# Patient Record
Sex: Female | Born: 1960
Health system: Southern US, Community
[De-identification: ages and names within clinical notes are randomized; demographics above are authoritative.]

## PROBLEM LIST (undated history)

## (undated) DIAGNOSIS — C801 Malignant (primary) neoplasm, unspecified: Secondary | ICD-10-CM

## (undated) DIAGNOSIS — E785 Hyperlipidemia, unspecified: Secondary | ICD-10-CM

## (undated) HISTORY — PX: TUBAL LIGATION: SHX77

## (undated) HISTORY — PX: TOOTH EXTRACTION: SUR596

## (undated) HISTORY — PX: ADENOIDECTOMY: SUR15

## (undated) HISTORY — PX: OTHER SURGICAL HISTORY: SHX169

## (undated) HISTORY — DX: Malignant (primary) neoplasm, unspecified: C80.1

## (undated) HISTORY — DX: Hyperlipidemia, unspecified: E78.5

## (undated) HISTORY — PX: MELANOMA EXCISION: SHX5266

---

## 1997-07-22 DIAGNOSIS — C801 Malignant (primary) neoplasm, unspecified: Secondary | ICD-10-CM

## 1997-07-22 HISTORY — DX: Malignant (primary) neoplasm, unspecified: C80.1

## 1999-09-27 DIAGNOSIS — C439 Malignant melanoma of skin, unspecified: Secondary | ICD-10-CM | POA: Insufficient documentation

## 1999-09-27 HISTORY — DX: Malignant melanoma of skin, unspecified: C43.9

## 1999-11-15 ENCOUNTER — Other Ambulatory Visit: Admission: RE | Admit: 1999-11-15 | Discharge: 1999-11-15 | Payer: Self-pay | Admitting: Obstetrics and Gynecology

## 2001-04-16 ENCOUNTER — Other Ambulatory Visit: Admission: RE | Admit: 2001-04-16 | Discharge: 2001-04-16 | Payer: Self-pay | Admitting: Obstetrics and Gynecology

## 2002-05-14 ENCOUNTER — Other Ambulatory Visit: Admission: RE | Admit: 2002-05-14 | Discharge: 2002-05-14 | Payer: Self-pay | Admitting: Obstetrics and Gynecology

## 2003-05-18 ENCOUNTER — Other Ambulatory Visit: Admission: RE | Admit: 2003-05-18 | Discharge: 2003-05-18 | Payer: Self-pay | Admitting: Obstetrics and Gynecology

## 2004-07-27 ENCOUNTER — Other Ambulatory Visit: Admission: RE | Admit: 2004-07-27 | Discharge: 2004-07-27 | Payer: Self-pay | Admitting: Obstetrics and Gynecology

## 2012-05-08 ENCOUNTER — Other Ambulatory Visit: Payer: Self-pay | Admitting: Obstetrics and Gynecology

## 2012-05-08 DIAGNOSIS — R928 Other abnormal and inconclusive findings on diagnostic imaging of breast: Secondary | ICD-10-CM

## 2012-05-12 ENCOUNTER — Ambulatory Visit
Admission: RE | Admit: 2012-05-12 | Discharge: 2012-05-12 | Disposition: A | Discharge status: Home / Self Care | Payer: Self-pay | Source: Ambulatory Visit | Attending: Obstetrics and Gynecology | Admitting: Obstetrics and Gynecology

## 2012-05-12 DIAGNOSIS — R928 Other abnormal and inconclusive findings on diagnostic imaging of breast: Secondary | ICD-10-CM

## 2012-05-13 ENCOUNTER — Other Ambulatory Visit: Payer: Self-pay

## 2012-07-22 LAB — HM COLONOSCOPY

## 2013-06-30 ENCOUNTER — Ambulatory Visit (INDEPENDENT_AMBULATORY_CARE_PROVIDER_SITE_OTHER): Payer: BC Managed Care – PPO

## 2013-06-30 VITALS — BP 104/69 | HR 79 | Resp 20 | Ht 71.0 in | Wt 210.0 lb

## 2013-06-30 DIAGNOSIS — B351 Tinea unguium: Secondary | ICD-10-CM

## 2013-06-30 DIAGNOSIS — M79609 Pain in unspecified limb: Secondary | ICD-10-CM

## 2013-06-30 MED ORDER — EFINACONAZOLE 10 % EX SOLN: CUTANEOUS | Status: DC

## 2013-06-30 NOTE — Progress Notes (Signed)
   Subjective:    Patient ID: Karen Cobb, female    DOB: 08/02/1960, 52 y.o.   MRN: [id number]  "Look at my toenails again.  I had that laser done last year.  It was doing good until  August.  I still been using the Formula 3."  HPI patient presents this time did have some improvement with the nail medication was using formula 3 for over your.    Review of Systems  Constitutional: Negative.   Respiratory: Negative.   Cardiovascular: Negative.   Gastrointestinal: Negative.   Musculoskeletal: Negative.   Skin: Negative.   Allergic/Immunologic: Negative.   Neurological: Negative.   Hematological: Negative.   Psychiatric/Behavioral: Negative.   All other systems reviewed and are negative.       Objective:   Physical Exam Neurovascular status is intact with pedal pulses palpable DP and PT +2/4 bilateral Refill timed 3-4 seconds all digits. Skin temperature is warm turgor normal no edema rubor pallor or varicosities noted. Neurologically epicritic and proprioceptive sensations intact bilateral with normal plantar response and DTRs. Dermatologically there is some yellowing and thickening discoloration of the right hallux in particular second third digits of the right foot. Remaining digits are relatively normal trophic. There is no secondary infection patient has a long second third toes more so on right than left foot. Has slight bunion deformity as well. No other abnormalities noted semirigid digital contractures the toes are identified. There is keratosis on the end of the third toe right foot which suggest that her shoes may be too short. Patient is advised of this and look into getting longer more appropriately fitting shoes to       Assessment & Plan:  Assessment this time onychomycosis affecting his 3 of the nails right hallux second third digit. No secondary infection is noted there was improvement of almost complete clearing and then it since has reverted back with discoloration  thickening yellowing. At this time discussed options at this time prescriptions for Julblia, been using topical nail antifungal is dispensed with instructions for use once daily for 12 months duration. At this time patient is also advised that and likely have her return in one month for a free retreatment with the laser palpable eradicating the fungus in her nails scheduled followup laser treatments in one month and plan for 12 months duration treatment topical antifungal at this time.  Alvan Dame DPM

## 2013-06-30 NOTE — Patient Instructions (Signed)

## 2013-07-28 ENCOUNTER — Ambulatory Visit (INDEPENDENT_AMBULATORY_CARE_PROVIDER_SITE_OTHER): Payer: BC Managed Care – PPO

## 2013-07-28 VITALS — Ht 71.0 in | Wt 210.0 lb

## 2013-07-28 DIAGNOSIS — B351 Tinea unguium: Secondary | ICD-10-CM

## 2013-07-28 NOTE — Patient Instructions (Signed)
Onychomycosis/Fungal Toenails  WHAT IS IT? An infection that lies within the keratin of your nail plate that is caused by a fungus.  WHY ME? Fungal infections affect all ages, sexes, races, and creeds.  There may be many factors that predispose you to a fungal infection such as age, coexisting medical conditions such as diabetes, or an autoimmune disease; stress, medications, fatigue, genetics, etc.  Bottom line: fungus thrives in a warm, moist environment and your shoes offer such a location.  IS IT CONTAGIOUS? Theoretically, yes.  You do not want to share shoes, nail clippers or files with someone who has fungal toenails.  Walking around barefoot in the same room or sleeping in the same bed is unlikely to transfer the organism.  It is important to realize, however, that fungus can spread easily from one nail to the next on the same foot.  HOW DO WE TREAT THIS?  There are several ways to treat this condition.  Treatment may depend on many factors such as age, medications, pregnancy, liver and kidney conditions, etc.  It is best to ask your doctor which options are available to you.  1. No treatment.   Unlike many other medical concerns, you can live with this condition.  However for many people this can be a painful condition and may lead to ingrown toenails or a bacterial infection.  It is recommended that you keep the nails cut short to help reduce the amount of fungal nail. 2. Topical treatment.  These range from herbal remedies to prescription strength nail lacquers.  About 40-50% effective, topicals require twice daily application for approximately 9 to 12 months or until an entirely new nail has grown out.  The most effective topicals are medical grade medications available through physicians offices. 3. Oral antifungal medications.  With an 80-90% cure rate, the most common oral medication requires 3 to 4 months of therapy and stays in your system for a year as the new nail grows out.  Oral  antifungal medications do require blood work to make sure it is a safe drug for you.  A liver function panel will be performed prior to starting the medication and after the first month of treatment.  It is important to have the blood work performed to avoid any harmful side effects.  In general, this medication safe but blood work is required. 4. Laser Therapy.  This treatment is performed by applying a specialized laser to the affected nail plate.  This therapy is noninvasive, fast, and non-painful.  It is not covered by insurance and is therefore, out of pocket.  The results have been very good with a 80-95% cure rate.  The [facility] is the only practice in the area to offer this therapy. 5. Permanent Nail Avulsion.  Removing the entire nail so that a new nail will not grow back.   Apply topical antifungal to affected nails daily for another 6-12 months.  Reappointed 3 months for a third laser treatment

## 2013-07-28 NOTE — Progress Notes (Signed)
   Subjective:    Patient ID: Karen Cobb, female    DOB: 1960-10-05, 53 y.o.   MRN: [id number]  "I'm here for laser treatment."  HPI    Review of Systems any changes or findings patient did not receive her topical antifungal she called in [place] and we'll get reassure for topical Jubilee a     Objective:   Physical Exam Neurovascular status is intact nails 13 and 5 of the right great right foot continues to have some white discoloration and friability consistent with onychomycosis at this time a second laser treatment is carried out as previous he discussed will do a third treatment with the next 2-3 months for followup. Patient is administered the laser treatment and following protocols into. Q. clear laser was utilized at this time with the following settings frequency level 5 Hz energy level for icing 1.03 J of energy influence of 7.5 J patient did receive treatment 2 the hallux third and fifth digits of the left foot her correction right foot total of 337 pulses were delivered to the 3 toes. Majority concentrating on the hallux. Patient tolerated this well and will continue with topical antifungal therapies in a home.       Assessment & Plan:  Assessment recalcitrant slowly healing improving tinea or onychomycosis affected nails 13 and 5 right foot second laser treatment carried out at this time we'll continue with topical antifungal therapies as indicated reappointed to 3 months for a third laser retreatment as recommended. Contact us visiting changes or exacerbations occur in the interim.  [name] DPM

## 2013-10-01 ENCOUNTER — Ambulatory Visit (INDEPENDENT_AMBULATORY_CARE_PROVIDER_SITE_OTHER): Payer: BC Managed Care – PPO

## 2013-10-01 VITALS — BP 129/79 | HR 67 | Resp 12

## 2013-10-01 DIAGNOSIS — B351 Tinea unguium: Secondary | ICD-10-CM

## 2013-10-01 NOTE — Patient Instructions (Signed)
Onychomycosis/Fungal Toenails  WHAT IS IT? An infection that lies within the keratin of your nail plate that is caused by a fungus.  WHY ME? Fungal infections affect all ages, sexes, races, and creeds.  There may be many factors that predispose you to a fungal infection such as age, coexisting medical conditions such as diabetes, or an autoimmune disease; stress, medications, fatigue, genetics, etc.  Bottom line: fungus thrives in a warm, moist environment and your shoes offer such a location.  IS IT CONTAGIOUS? Theoretically, yes.  You do not want to share shoes, nail clippers or files with someone who has fungal toenails.  Walking around barefoot in the same room or sleeping in the same bed is unlikely to transfer the organism.  It is important to realize, however, that fungus can spread easily from one nail to the next on the same foot.  HOW DO WE TREAT THIS?  There are several ways to treat this condition.  Treatment may depend on many factors such as age, medications, pregnancy, liver and kidney conditions, etc.  It is best to ask your doctor which options are available to you.  1. No treatment.   Unlike many other medical concerns, you can live with this condition.  However for many people this can be a painful condition and may lead to ingrown toenails or a bacterial infection.  It is recommended that you keep the nails cut short to help reduce the amount of fungal nail. 2. Topical treatment.  These range from herbal remedies to prescription strength nail lacquers.  About 40-50% effective, topicals require twice daily application for approximately 9 to 12 months or until an entirely new nail has grown out.  The most effective topicals are medical grade medications available through physicians offices. 3. Oral antifungal medications.  With an 80-90% cure rate, the most common oral medication requires 3 to 4 months of therapy and stays in your system for a year as the new nail grows out.  Oral  antifungal medications do require blood work to make sure it is a safe drug for you.  A liver function panel will be performed prior to starting the medication and after the first month of treatment.  It is important to have the blood work performed to avoid any harmful side effects.  In general, this medication safe but blood work is required. 4. Laser Therapy.  This treatment is performed by applying a specialized laser to the affected nail plate.  This therapy is noninvasive, fast, and non-painful.  It is not covered by insurance and is therefore, out of pocket.  The results have been very good with a 80-95% cure rate.  The [facility] is the only practice in the area to offer this therapy. 5. Permanent Nail Avulsion.  Removing the entire nail so that a new nail will not grow back.  Continue daily applications of topical antifungal for another 6 months

## 2013-10-01 NOTE — Progress Notes (Signed)
   Subjective:    Patient ID: Karen Cobb, female    DOB: 08-13-1960, 52 y.o.   MRN: [id number]  HPI TREATMENT #3 '' RT FOOT TOES ARE GETTING A LITTLE BETTER.''   Review of Systems no new changes or findings     Objective:   Physical Exam Neurovascular status is intact pedal pulses palpable epicritic and proprioceptive sensations intact and symmetric bilateral normal plantar response DTRs not listed patient does have continued onychomycosis nails 13 and 5 of the left foot been applying Karen Cobb she's had to laser treatments at this time is ready for a third Mr. treatment at this time Q. clear laser was utilized at energy level frequency of L5 hurts energy level 1.03 J influence of 7.5 J patient did receive at this time hallux second and third digits a total of 338 pulses were delivered at this time. Operative report caused by protection in a seated your utilizing or area this time. The patient how the procedure without and difficulty discomfort suggested followup in 6 months continue with topical antifungal therapy date daily applications as instructed       Assessment & Plan:  Assessment this time third laser retreatment for fungus nails first third and fifth digit left foot is carried out at this time with little difficulty or discomfort patient will followup in 6 months for reevaluation continue with topical Karen Cobb application daily. Next  Karen Cobb DPM

## 2014-04-08 ENCOUNTER — Ambulatory Visit (INDEPENDENT_AMBULATORY_CARE_PROVIDER_SITE_OTHER): Payer: BC Managed Care – PPO

## 2014-04-08 VITALS — BP 138/80 | HR 64 | Resp 12

## 2014-04-08 DIAGNOSIS — B351 Tinea unguium: Secondary | ICD-10-CM

## 2014-04-08 MED ORDER — EFINACONAZOLE 10 % EX SOLN: CUTANEOUS | Status: DC

## 2014-04-08 NOTE — Progress Notes (Signed)
   Subjective:    Patient ID: ENVY MENO, female    DOB: Feb 10, 1961, 53 y.o.   MRN: [id number]  HPI ''RT 1ST, 3RD, AND 5TH TOENAIL ARE LOOKS MUCH BETTER BUT GREAT TOENAIL HAVE BUBBLE/POCKET SOMETIMES.''   Review of Systems no new findings or systemic changes noted     Objective:   Physical Exam Patient been applying Gregary Signs the affected nails and there was some improvement in nails improve particular hallux nail bed and the third and fifth toes however may stop sending the medication. Is my understanding that the company has changed contracts with previous pharmacy and no longer shipping will need to read issued a prescription for Gregary Signs to the new pharmacy. Neurovascular status otherwise intact no other new changes noted no pain or discomfort in the nails improving with the topical medications however needs to continue for lace other 6 or more months       Assessment & Plan:  Assessment onychomycosis responding to topical Gregary Signs patient will continue with the medication represcribed Gregary Signs to [facility] at this time patient advised to continue daily applications as instructed recheck in 6-12 months for followup next  [name] DPM

## 2014-04-08 NOTE — Patient Instructions (Signed)
Your medication will not come for new pharmacy [facility].  Continue to apply Jublia once daily to each affected toenail for 6-12 additional month

## 2014-06-01 ENCOUNTER — Telehealth: Payer: Self-pay | Admitting: *Deleted

## 2014-06-01 NOTE — Telephone Encounter (Signed)
Patient called requesting the phone number for the pharmacy that she gets her Jublia from.  I called and left her a message that [facility] number is [phone].  Call if you have any further questions.

## 2015-06-19 LAB — HM PAP SMEAR

## 2015-06-19 LAB — HM MAMMOGRAPHY: HM Mammogram: NORMAL

## 2015-07-31 ENCOUNTER — Telehealth: Payer: Self-pay | Admitting: Behavioral Health

## 2015-07-31 ENCOUNTER — Encounter: Payer: Self-pay | Admitting: Behavioral Health

## 2015-07-31 NOTE — Telephone Encounter (Signed)
Pre-Visit Call completed with patient and chart updated.   Pre-Visit Info documented in Specialty Comments under [facility].    

## 2015-08-01 ENCOUNTER — Encounter: Payer: Self-pay | Admitting: Family

## 2015-08-01 ENCOUNTER — Ambulatory Visit (INDEPENDENT_AMBULATORY_CARE_PROVIDER_SITE_OTHER): Payer: 59 | Admitting: Family

## 2015-08-01 VITALS — BP 117/68 | HR 66 | Temp 97.9°F | Resp 18 | Ht 70.5 in | Wt 220.4 lb

## 2015-08-01 DIAGNOSIS — Z23 Encounter for immunization: Secondary | ICD-10-CM

## 2015-08-01 DIAGNOSIS — Z86006 Personal history of melanoma in-situ: Secondary | ICD-10-CM

## 2015-08-01 DIAGNOSIS — E785 Hyperlipidemia, unspecified: Secondary | ICD-10-CM | POA: Diagnosis not present

## 2015-08-01 DIAGNOSIS — Z85820 Personal history of malignant melanoma of skin: Secondary | ICD-10-CM | POA: Diagnosis not present

## 2015-08-01 DIAGNOSIS — Z0000 Encounter for general adult medical examination without abnormal findings: Secondary | ICD-10-CM

## 2015-08-01 NOTE — Progress Notes (Signed)
Pre visit review using our clinic review tool, if applicable. No additional management support is needed unless otherwise documented below in the visit note. 

## 2015-08-01 NOTE — Assessment & Plan Note (Signed)
Continue routine skin screenings.

## 2015-08-01 NOTE — Progress Notes (Signed)
Subjective:    Patient ID: Karen Cobb, female    DOB: 1961/01/10, 55 y.o.   MRN: [id number]  HPI  Karen Cobb is a 55 yr old female who presents today to establish care.  Pmhx is significant for:  Hyperlipidemia- she in on lipitor 10mg .  (she is followed at [facility]) had labs in November  Melanoma-foot (1999) reports that this was in situ- sees Dr. Denna Haggard for routine skin screening.    Patient presents today for complete physical.  Immunizations: >10 yrs ago Diet: fair diet.  Has lost 15 pounds since the summer Exercise: exercises 5 days a week.  Bike elliptical, treadmill, weights Colonoscopy: 2014- normal per patient Dexa: never had bone density Pap Smear: 11/16- normal Mammogram: 11/16- normal Dental: up to date Vision- 10/16    Review of Systems  Constitutional: Negative for unexpected weight change.  HENT: Negative for rhinorrhea.   Eyes: Negative for visual disturbance.  Respiratory: Negative for cough.   Cardiovascular: Negative for leg swelling.  Gastrointestinal: Negative for diarrhea and constipation.  Genitourinary: Negative for dysuria and frequency.  Musculoskeletal: Negative for myalgias and arthralgias.  Skin: Negative for rash.  Neurological: Negative for headaches.  Hematological: Negative for adenopathy.  Psychiatric/Behavioral:       Denies depression/anxiety       Past Medical History  Diagnosis Date  . Hyperlipidemia   . Cancer ([place]) 1999    melanoma    Social History   Social History  . Marital Status: Married    Spouse Name: N/A  . Number of Children: N/A  . Years of Education: N/A   Occupational History  . Not on file.   Social History Main Topics  . Smoking status: Former Research scientist (life sciences)  . Smokeless tobacco: Never Used  . Alcohol Use: 0.0 oz/week    0 Standard drinks or equivalent per week     Comment: rarely  . Drug Use: No  . Sexual Activity: Not on file   Other Topics Concern  . Not on file   Social History  Narrative    Past Surgical History  Procedure Laterality Date  . Tubal ligation    . Adenoidectomy    . Melanoma excision Right     foot  . Tooth extraction      Family History  Problem Relation Age of Onset  . Cancer Mother   . Heart disease Father   . Hypertension Father     No Known Allergies  No current outpatient prescriptions on file prior to visit.   No current facility-administered medications on file prior to visit.    BP 117/68 mmHg  Pulse 66  Temp(Src) 97.9 F (36.6 C) (Oral)  Resp 18  Ht 5' 10.5" (1.791 m)  Wt 220 lb 6.4 oz (99.973 kg)  BMI 31.17 kg/m2  SpO2 100%    Objective:   Physical Exam Physical Exam  Constitutional: She is oriented to person, place, and time. She appears well-developed and well-nourished. No distress.  HENT:  Head: Normocephalic and atraumatic.  Right Ear: Tympanic membrane and ear canal normal.  Left Ear: Tympanic membrane and ear canal normal.  Mouth/Throat: Oropharynx is clear and moist.  Eyes: Pupils are equal, round, and reactive to light. No scleral icterus.  Neck: Normal range of motion. No thyromegaly present.  Cardiovascular: Normal rate and regular rhythm.   No murmur heard. Pulmonary/Chest: Effort normal and breath sounds normal. No respiratory distress. He has no wheezes. She has no rales. She exhibits no  tenderness.  Abdominal: Soft. Bowel sounds are normal. He exhibits no distension and no mass. There is no tenderness. There is no rebound and no guarding.  Musculoskeletal: She exhibits no edema.  Lymphadenopathy:    She has no cervical adenopathy.  Neurological: She is alert and oriented to person, place, and time. She has 1+ patellar reflexes. She exhibits normal muscle tone. Coordination normal.  Skin: Skin is warm and dry.  Psychiatric: She has a normal mood and affect. Her behavior is normal. Judgment and thought content normal.  Breast/pelvis: deferred to GYN       Assessment & Plan:            Assessment & Plan:  EKG tracing is personally reviewed.  EKG notes NSR.  No acute changes.

## 2015-08-01 NOTE — Assessment & Plan Note (Signed)
Discussed healthy diet, exercise, weight loss.  Obtain lab work from [facility]. Tdap today.

## 2015-08-01 NOTE — Assessment & Plan Note (Signed)
On lipitor- will request lipid panel from her GYN office.

## 2015-08-01 NOTE — Patient Instructions (Signed)
You will be contacted about scheduling your bone density.  Welcome to [facility]!  Preventive Care for Adults, Female A healthy lifestyle and preventive care can promote health and wellness. Preventive health guidelines for women include the following key practices.  A routine yearly physical is a good way to check with your health care provider about your health and preventive screening. It is a chance to share any concerns and updates on your health and to receive a thorough exam.  Visit your dentist for a routine exam and preventive care every 6 months. Brush your teeth twice a day and floss once a day. Good oral hygiene prevents tooth decay and gum disease.  The frequency of eye exams is based on your age, health, family medical history, use of contact lenses, and other factors. Follow your health care provider's recommendations for frequency of eye exams.  Eat a healthy diet. Foods like vegetables, fruits, whole grains, low-fat dairy products, and lean protein foods contain the nutrients you need without too many calories. Decrease your intake of foods high in solid fats, added sugars, and salt. Eat the right amount of calories for you.Get information about a proper diet from your health care provider, if necessary.  Regular physical exercise is one of the most important things you can do for your health. Most adults should get at least 150 minutes of moderate-intensity exercise (any activity that increases your heart rate and causes you to sweat) each week. In addition, most adults need muscle-strengthening exercises on 2 or more days a week.  Maintain a healthy weight. The body mass index (BMI) is a screening tool to identify possible weight problems. It provides an estimate of body fat based on height and weight. Your health care provider can find your BMI and can help you achieve or maintain a healthy weight.For adults 20 years and older:  A BMI below 18.5 is considered underweight.  A  BMI of 18.5 to 24.9 is normal.  A BMI of 25 to 29.9 is considered overweight.  A BMI of 30 and above is considered obese.  Maintain normal blood lipids and cholesterol levels by exercising and minimizing your intake of saturated fat. Eat a balanced diet with plenty of fruit and vegetables. Blood tests for lipids and cholesterol should begin at age 36 and be repeated every 5 years. If your lipid or cholesterol levels are high, you are over 50, or you are at high risk for heart disease, you may need your cholesterol levels checked more frequently.Ongoing high lipid and cholesterol levels should be treated with medicines if diet and exercise are not working.  If you smoke, find out from your health care provider how to quit. If you do not use tobacco, do not start.  Lung cancer screening is recommended for adults aged 66-80 years who are at high risk for developing lung cancer because of a history of smoking. A yearly low-dose CT scan of the lungs is recommended for people who have at least a 30-pack-year history of smoking and are a current smoker or have quit within the past 15 years. A pack year of smoking is smoking an average of 1 pack of cigarettes a day for 1 year (for example: 1 pack a day for 30 years or 2 packs a day for 15 years). Yearly screening should continue until the smoker has stopped smoking for at least 15 years. Yearly screening should be stopped for people who develop a health problem that would prevent them from having lung  cancer treatment.  If you are pregnant, do not drink alcohol. If you are breastfeeding, be very cautious about drinking alcohol. If you are not pregnant and choose to drink alcohol, do not have more than 1 drink per day. One drink is considered to be 12 ounces (355 mL) of beer, 5 ounces (148 mL) of wine, or 1.5 ounces (44 mL) of liquor.  Avoid use of street drugs. Do not share needles with anyone. Ask for help if you need support or instructions about stopping  the use of drugs.  High blood pressure causes heart disease and increases the risk of stroke. Your blood pressure should be checked at least every 1 to 2 years. Ongoing high blood pressure should be treated with medicines if weight loss and exercise do not work.  If you are 60-46 years old, ask your health care provider if you should take aspirin to prevent strokes.  Diabetes screening is done by taking a blood sample to check your blood glucose level after you have not eaten for a certain period of time (fasting). If you are not overweight and you do not have risk factors for diabetes, you should be screened once every 3 years starting at age 78. If you are overweight or obese and you are 75-24 years of age, you should be screened for diabetes every year as part of your cardiovascular risk assessment.  Breast cancer screening is essential preventive care for women. You should practice "breast self-awareness." This means understanding the normal appearance and feel of your breasts and may include breast self-examination. Any changes detected, no matter how small, should be reported to a health care provider. Women in their 64s and 30s should have a clinical breast exam (CBE) by a health care provider as part of a regular health exam every 1 to 3 years. After age 38, women should have a CBE every year. Starting at age 35, women should consider having a mammogram (breast X-ray test) every year. Women who have a family history of breast cancer should talk to their health care provider about genetic screening. Women at a high risk of breast cancer should talk to their health care providers about having an MRI and a mammogram every year.  Breast cancer gene (BRCA)-related cancer risk assessment is recommended for women who have family members with BRCA-related cancers. BRCA-related cancers include breast, ovarian, tubal, and peritoneal cancers. Having family members with these cancers may be associated with an  increased risk for harmful changes (mutations) in the breast cancer genes BRCA1 and BRCA2. Results of the assessment will determine the need for genetic counseling and BRCA1 and BRCA2 testing.  Your health care provider may recommend that you be screened regularly for cancer of the pelvic organs (ovaries, uterus, and vagina). This screening involves a pelvic examination, including checking for microscopic changes to the surface of your cervix (Pap test). You may be encouraged to have this screening done every 3 years, beginning at age 73.  For women ages 21-65, health care providers may recommend pelvic exams and Pap testing every 3 years, or they may recommend the Pap and pelvic exam, combined with testing for human papilloma virus (HPV), every 5 years. Some types of HPV increase your risk of cervical cancer. Testing for HPV may also be done on women of any age with unclear Pap test results.  Other health care providers may not recommend any screening for nonpregnant women who are considered low risk for pelvic cancer and who do not have  symptoms. Ask your health care provider if a screening pelvic exam is right for you.  If you have had past treatment for cervical cancer or a condition that could lead to cancer, you need Pap tests and screening for cancer for at least 20 years after your treatment. If Pap tests have been discontinued, your risk factors (such as having a new sexual partner) need to be reassessed to determine if screening should resume. Some women have medical problems that increase the chance of getting cervical cancer. In these cases, your health care provider may recommend more frequent screening and Pap tests.  Colorectal cancer can be detected and often prevented. Most routine colorectal cancer screening begins at the age of 10 years and continues through age 44 years. However, your health care provider may recommend screening at an earlier age if you have risk factors for colon  cancer. On a yearly basis, your health care provider may provide home test kits to check for hidden blood in the stool. Use of a small camera at the end of a tube, to directly examine the colon (sigmoidoscopy or colonoscopy), can detect the earliest forms of colorectal cancer. Talk to your health care provider about this at age 77, when routine screening begins. Direct exam of the colon should be repeated every 5-10 years through age 57 years, unless early forms of precancerous polyps or small growths are found.  People who are at an increased risk for hepatitis B should be screened for this virus. You are considered at high risk for hepatitis B if:  You were born in a country where hepatitis B occurs often. Talk with your health care provider about which countries are considered high risk.  Your parents were born in a high-risk country and you have not received a shot to protect against hepatitis B (hepatitis B vaccine).  You have HIV or AIDS.  You use needles to inject street drugs.  You live with, or have sex with, someone who has hepatitis B.  You get hemodialysis treatment.  You take certain medicines for conditions like cancer, organ transplantation, and autoimmune conditions.  Hepatitis C blood testing is recommended for all people born from [birth year] through 1964 and any individual with known risks for hepatitis C.  Practice safe sex. Use condoms and avoid high-risk sexual practices to reduce the spread of sexually transmitted infections (STIs). STIs include gonorrhea, chlamydia, syphilis, trichomonas, herpes, HPV, and human immunodeficiency virus (HIV). Herpes, HIV, and HPV are viral illnesses that have no cure. They can result in disability, cancer, and death.  You should be screened for sexually transmitted illnesses (STIs) including gonorrhea and chlamydia if:  You are sexually active and are younger than 24 years.  You are older than 24 years and your health care provider tells you  that you are at risk for this type of infection.  Your sexual activity has changed since you were last screened and you are at an increased risk for chlamydia or gonorrhea. Ask your health care provider if you are at risk.  If you are at risk of being infected with HIV, it is recommended that you take a prescription medicine daily to prevent HIV infection. This is called preexposure prophylaxis (PrEP). You are considered at risk if:  You are sexually active and do not regularly use condoms or know the HIV status of your partner(s).  You take drugs by injection.  You are sexually active with a partner who has HIV.  Talk with your health care  provider about whether you are at high risk of being infected with HIV. If you choose to begin PrEP, you should first be tested for HIV. You should then be tested every 3 months for as long as you are taking PrEP.  Osteoporosis is a disease in which the bones lose minerals and strength with aging. This can result in serious bone fractures or breaks. The risk of osteoporosis can be identified using a bone density scan. Women ages 35 years and over and women at risk for fractures or osteoporosis should discuss screening with their health care providers. Ask your health care provider whether you should take a calcium supplement or vitamin D to reduce the rate of osteoporosis.  Menopause can be associated with physical symptoms and risks. Hormone replacement therapy is available to decrease symptoms and risks. You should talk to your health care provider about whether hormone replacement therapy is right for you.  Use sunscreen. Apply sunscreen liberally and repeatedly throughout the day. You should seek shade when your shadow is shorter than you. Protect yourself by wearing long sleeves, pants, a wide-brimmed hat, and sunglasses year round, whenever you are outdoors.  Once a month, do a whole body skin exam, using a mirror to look at the skin on your back. Tell  your health care provider of new moles, moles that have irregular borders, moles that are larger than a pencil eraser, or moles that have changed in shape or color.  Stay current with required vaccines (immunizations).  Influenza vaccine. All adults should be immunized every year.  Tetanus, diphtheria, and acellular pertussis (Td, Tdap) vaccine. Pregnant women should receive 1 dose of Tdap vaccine during each pregnancy. The dose should be obtained regardless of the length of time since the last dose. Immunization is preferred during the 27th-36th week of gestation. An adult who has not previously received Tdap or who does not know her vaccine status should receive 1 dose of Tdap. This initial dose should be followed by tetanus and diphtheria toxoids (Td) booster doses every 10 years. Adults with an unknown or incomplete history of completing a 3-dose immunization series with Td-containing vaccines should begin or complete a primary immunization series including a Tdap dose. Adults should receive a Td booster every 10 years.  Varicella vaccine. An adult without evidence of immunity to varicella should receive 2 doses or a second dose if she has previously received 1 dose. Pregnant females who do not have evidence of immunity should receive the first dose after pregnancy. This first dose should be obtained before leaving the health care facility. The second dose should be obtained 4-8 weeks after the first dose.  Human papillomavirus (HPV) vaccine. Females aged 13-26 years who have not received the vaccine previously should obtain the 3-dose series. The vaccine is not recommended for use in pregnant females. However, pregnancy testing is not needed before receiving a dose. If a female is found to be pregnant after receiving a dose, no treatment is needed. In that case, the remaining doses should be delayed until after the pregnancy. Immunization is recommended for any person with an immunocompromised  condition through the age of 26 years if she did not get any or all doses earlier. During the 3-dose series, the second dose should be obtained 4-8 weeks after the first dose. The third dose should be obtained 24 weeks after the first dose and 16 weeks after the second dose.  Zoster vaccine. One dose is recommended for adults aged 57 years or older  unless certain conditions are present.  Measles, mumps, and rubella (MMR) vaccine. Adults born before [birth year] generally are considered immune to measles and mumps. Adults born in [birth year] or later should have 1 or more doses of MMR vaccine unless there is a contraindication to the vaccine or there is laboratory evidence of immunity to each of the three diseases. A routine second dose of MMR vaccine should be obtained at least 28 days after the first dose for students attending postsecondary schools, health care workers, or international travelers. People who received inactivated measles vaccine or an unknown type of measles vaccine during 1963-1967 should receive 2 doses of MMR vaccine. People who received inactivated mumps vaccine or an unknown type of mumps vaccine before 1979 and are at high risk for mumps infection should consider immunization with 2 doses of MMR vaccine. For females of childbearing age, rubella immunity should be determined. If there is no evidence of immunity, females who are not pregnant should be vaccinated. If there is no evidence of immunity, females who are pregnant should delay immunization until after pregnancy. Unvaccinated health care workers born before [birth year] who lack laboratory evidence of measles, mumps, or rubella immunity or laboratory confirmation of disease should consider measles and mumps immunization with 2 doses of MMR vaccine or rubella immunization with 1 dose of MMR vaccine.  Pneumococcal 13-valent conjugate (PCV13) vaccine. When indicated, a person who is uncertain of his immunization history and has no record of immunization  should receive the PCV13 vaccine. All adults 22 years of age and older should receive this vaccine. An adult aged 87 years or older who has certain medical conditions and has not been previously immunized should receive 1 dose of PCV13 vaccine. This PCV13 should be followed with a dose of pneumococcal polysaccharide (PPSV23) vaccine. Adults who are at high risk for pneumococcal disease should obtain the PPSV23 vaccine at least 8 weeks after the dose of PCV13 vaccine. Adults older than 54 years of age who have normal immune system function should obtain the PPSV23 vaccine dose at least 1 year after the dose of PCV13 vaccine.  Pneumococcal polysaccharide (PPSV23) vaccine. When PCV13 is also indicated, PCV13 should be obtained first. All adults aged 31 years and older should be immunized. An adult younger than age 11 years who has certain medical conditions should be immunized. Any person who resides in a nursing home or long-term care facility should be immunized. An adult smoker should be immunized. People with an immunocompromised condition and certain other conditions should receive both PCV13 and PPSV23 vaccines. People with human immunodeficiency virus (HIV) infection should be immunized as soon as possible after diagnosis. Immunization during chemotherapy or radiation therapy should be avoided. Routine use of PPSV23 vaccine is not recommended for American Indians, [place] Natives, or people younger than 65 years unless there are medical conditions that require PPSV23 vaccine. When indicated, people who have unknown immunization and have no record of immunization should receive PPSV23 vaccine. One-time revaccination 5 years after the first dose of PPSV23 is recommended for people aged 19-64 years who have chronic kidney failure, nephrotic syndrome, asplenia, or immunocompromised conditions. People who received 1-2 doses of PPSV23 before age 38 years should receive another dose of PPSV23 vaccine at age 77 years  or later if at least 5 years have passed since the previous dose. Doses of PPSV23 are not needed for people immunized with PPSV23 at or after age 45 years.  Meningococcal vaccine. Adults with asplenia or persistent complement component deficiencies should  receive 2 doses of quadrivalent meningococcal conjugate (MenACWY-D) vaccine. The doses should be obtained at least 2 months apart. Microbiologists working with certain meningococcal bacteria, [place] recruits, people at risk during an outbreak, and people who travel to or live in countries with a high rate of meningitis should be immunized. A first-year college student up through age 15 years who is living in a residence hall should receive a dose if she did not receive a dose on or after her 16th birthday. Adults who have certain high-risk conditions should receive one or more doses of vaccine.  Hepatitis A vaccine. Adults who wish to be protected from this disease, have certain high-risk conditions, work with hepatitis A-infected animals, work in hepatitis A research labs, or travel to or work in countries with a high rate of hepatitis A should be immunized. Adults who were previously unvaccinated and who anticipate close contact with an international adoptee during the first 60 days after arrival in the [place] from a country with a high rate of hepatitis A should be immunized.  Hepatitis B vaccine. Adults who wish to be protected from this disease, have certain high-risk conditions, may be exposed to blood or other infectious body fluids, are household contacts or sex partners of hepatitis B positive people, are clients or workers in certain care facilities, or travel to or work in countries with a high rate of hepatitis B should be immunized.  Haemophilus influenzae type b (Hib) vaccine. A previously unvaccinated person with asplenia or sickle cell disease or having a scheduled splenectomy should receive 1 dose of Hib vaccine. Regardless of  previous immunization, a recipient of a hematopoietic stem cell transplant should receive a 3-dose series 6-12 months after her successful transplant. Hib vaccine is not recommended for adults with HIV infection. Preventive Services / Frequency Ages 57 to 52 years  Blood pressure check.** / Every 3-5 years.  Lipid and cholesterol check.** / Every 5 years beginning at age 12.  Clinical breast exam.** / Every 3 years for women in their 48s and 60s.  BRCA-related cancer risk assessment.** / For women who have family members with a BRCA-related cancer (breast, ovarian, tubal, or peritoneal cancers).  Pap test.** / Every 2 years from ages 37 through 24. Every 3 years starting at age 8 through age 48 or 14 with a history of 3 consecutive normal Pap tests.  HPV screening.** / Every 3 years from ages 76 through ages 74 to 34 with a history of 3 consecutive normal Pap tests.  Hepatitis C blood test.** / For any individual with known risks for hepatitis C.  Skin self-exam. / Monthly.  Influenza vaccine. / Every year.  Tetanus, diphtheria, and acellular pertussis (Tdap, Td) vaccine.** / Consult your health care provider. Pregnant women should receive 1 dose of Tdap vaccine during each pregnancy. 1 dose of Td every 10 years.  Varicella vaccine.** / Consult your health care provider. Pregnant females who do not have evidence of immunity should receive the first dose after pregnancy.  HPV vaccine. / 3 doses over 6 months, if 58 and younger. The vaccine is not recommended for use in pregnant females. However, pregnancy testing is not needed before receiving a dose.  Measles, mumps, rubella (MMR) vaccine.** / You need at least 1 dose of MMR if you were born in 1957 or later. You may also need a 2nd dose. For females of childbearing age, rubella immunity should be determined. If there is no evidence of immunity, females who are not  pregnant should be vaccinated. If there is no evidence of immunity,  females who are pregnant should delay immunization until after pregnancy.  Pneumococcal 13-valent conjugate (PCV13) vaccine.** / Consult your health care provider.  Pneumococcal polysaccharide (PPSV23) vaccine.** / 1 to 2 doses if you smoke cigarettes or if you have certain conditions.  Meningococcal vaccine.** / 1 dose if you are age 74 to 8 years and a Market researcher living in a residence hall, or have one of several medical conditions, you need to get vaccinated against meningococcal disease. You may also need additional booster doses.  Hepatitis A vaccine.** / Consult your health care provider.  Hepatitis B vaccine.** / Consult your health care provider.  Haemophilus influenzae type b (Hib) vaccine.** / Consult your health care provider. Ages 34 to 68 years  Blood pressure check.** / Every year.  Lipid and cholesterol check.** / Every 5 years beginning at age 56 years.  Lung cancer screening. / Every year if you are aged 88-80 years and have a 30-pack-year history of smoking and currently smoke or have quit within the past 15 years. Yearly screening is stopped once you have quit smoking for at least 15 years or develop a health problem that would prevent you from having lung cancer treatment.  Clinical breast exam.** / Every year after age 65 years.  BRCA-related cancer risk assessment.** / For women who have family members with a BRCA-related cancer (breast, ovarian, tubal, or peritoneal cancers).  Mammogram.** / Every year beginning at age 90 years and continuing for as long as you are in good health. Consult with your health care provider.  Pap test.** / Every 3 years starting at age 40 years through age 54 or 32 years with a history of 3 consecutive normal Pap tests.  HPV screening.** / Every 3 years from ages 77 years through ages 86 to 39 years with a history of 3 consecutive normal Pap tests.  Fecal occult blood test (FOBT) of stool. / Every year beginning at  age 41 years and continuing until age 31 years. You may not need to do this test if you get a colonoscopy every 10 years.  Flexible sigmoidoscopy or colonoscopy.** / Every 5 years for a flexible sigmoidoscopy or every 10 years for a colonoscopy beginning at age 38 years and continuing until age 32 years.  Hepatitis C blood test.** / For all people born from [birth year] through 1964 and any individual with known risks for hepatitis C.  Skin self-exam. / Monthly.  Influenza vaccine. / Every year.  Tetanus, diphtheria, and acellular pertussis (Tdap/Td) vaccine.** / Consult your health care provider. Pregnant women should receive 1 dose of Tdap vaccine during each pregnancy. 1 dose of Td every 10 years.  Varicella vaccine.** / Consult your health care provider. Pregnant females who do not have evidence of immunity should receive the first dose after pregnancy.  Zoster vaccine.** / 1 dose for adults aged 20 years or older.  Measles, mumps, rubella (MMR) vaccine.** / You need at least 1 dose of MMR if you were born in 1957 or later. You may also need a second dose. For females of childbearing age, rubella immunity should be determined. If there is no evidence of immunity, females who are not pregnant should be vaccinated. If there is no evidence of immunity, females who are pregnant should delay immunization until after pregnancy.  Pneumococcal 13-valent conjugate (PCV13) vaccine.** / Consult your health care provider.  Pneumococcal polysaccharide (PPSV23) vaccine.** / 1 to 2 doses  if you smoke cigarettes or if you have certain conditions.  Meningococcal vaccine.** / Consult your health care provider.  Hepatitis A vaccine.** / Consult your health care provider.  Hepatitis B vaccine.** / Consult your health care provider.  Haemophilus influenzae type b (Hib) vaccine.** / Consult your health care provider. Ages 58 years and over  Blood pressure check.** / Every year.  Lipid and cholesterol check.**  / Every 5 years beginning at age 20 years.  Lung cancer screening. / Every year if you are aged 69-80 years and have a 30-pack-year history of smoking and currently smoke or have quit within the past 15 years. Yearly screening is stopped once you have quit smoking for at least 15 years or develop a health problem that would prevent you from having lung cancer treatment.  Clinical breast exam.** / Every year after age 36 years.  BRCA-related cancer risk assessment.** / For women who have family members with a BRCA-related cancer (breast, ovarian, tubal, or peritoneal cancers).  Mammogram.** / Every year beginning at age 66 years and continuing for as long as you are in good health. Consult with your health care provider.  Pap test.** / Every 3 years starting at age 48 years through age 59 or 2 years with 3 consecutive normal Pap tests. Testing can be stopped between 65 and 70 years with 3 consecutive normal Pap tests and no abnormal Pap or HPV tests in the past 10 years.  HPV screening.** / Every 3 years from ages 68 years through ages 3 or 70 years with a history of 3 consecutive normal Pap tests. Testing can be stopped between 65 and 70 years with 3 consecutive normal Pap tests and no abnormal Pap or HPV tests in the past 10 years.  Fecal occult blood test (FOBT) of stool. / Every year beginning at age 30 years and continuing until age 67 years. You may not need to do this test if you get a colonoscopy every 10 years.  Flexible sigmoidoscopy or colonoscopy.** / Every 5 years for a flexible sigmoidoscopy or every 10 years for a colonoscopy beginning at age 11 years and continuing until age 73 years.  Hepatitis C blood test.** / For all people born from [birth year] through 1965 and any individual with known risks for hepatitis C.  Osteoporosis screening.** / A one-time screening for women ages 27 years and over and women at risk for fractures or osteoporosis.  Skin self-exam. / Monthly.  Influenza  vaccine. / Every year.  Tetanus, diphtheria, and acellular pertussis (Tdap/Td) vaccine.** / 1 dose of Td every 10 years.  Varicella vaccine.** / Consult your health care provider.  Zoster vaccine.** / 1 dose for adults aged 58 years or older.  Pneumococcal 13-valent conjugate (PCV13) vaccine.** / Consult your health care provider.  Pneumococcal polysaccharide (PPSV23) vaccine.** / 1 dose for all adults aged 34 years and older.  Meningococcal vaccine.** / Consult your health care provider.  Hepatitis A vaccine.** / Consult your health care provider.  Hepatitis B vaccine.** / Consult your health care provider.  Haemophilus influenzae type b (Hib) vaccine.** / Consult your health care provider. ** Family history and personal history of risk and conditions may change your health care provider's recommendations.   This information is not intended to replace advice given to you by your health care provider. Make sure you discuss any questions you have with your health care provider.   Document Released: 09/03/2001 Document Revised: 07/29/2014 Document Reviewed: 12/03/2010 Elsevier Interactive Patient Education 2016  [employer].

## 2015-08-07 ENCOUNTER — Telehealth: Payer: Self-pay | Admitting: *Deleted

## 2015-08-07 NOTE — Telephone Encounter (Signed)
Pt signed records release to [facility] and it was faxed to [phone] on 08/01/15 at 11:05 am. Awaiting records.

## 2015-08-13 ENCOUNTER — Encounter: Payer: Self-pay | Admitting: Family

## 2015-08-14 MED ORDER — NALTREXONE-BUPROPION HCL ER 8-90 MG PO TB12: ORAL_TABLET | ORAL | Status: DC

## 2016-02-29 ENCOUNTER — Encounter: Payer: Self-pay | Admitting: Family

## 2016-03-01 ENCOUNTER — Other Ambulatory Visit: Payer: Self-pay | Admitting: Family

## 2016-03-01 MED ORDER — ATORVASTATIN CALCIUM 10 MG PO TABS: 10.0000 mg | ORAL_TABLET | Freq: Every day | ORAL | 6 refills | Status: DC

## 2016-03-01 NOTE — Progress Notes (Signed)
LM to schedule bone density at [facility] office

## 2016-03-01 NOTE — Progress Notes (Signed)
Could you please help pt get bone density scheduled?

## 2016-03-01 NOTE — Progress Notes (Signed)
Pt is scheduled for 8/15

## 2016-03-05 ENCOUNTER — Encounter: Payer: Self-pay | Admitting: Radiology

## 2016-03-05 ENCOUNTER — Ambulatory Visit (INDEPENDENT_AMBULATORY_CARE_PROVIDER_SITE_OTHER)
Admission: RE | Admit: 2016-03-05 | Discharge: 2016-03-05 | Disposition: A | Discharge status: Home / Self Care | Payer: 59 | Source: Ambulatory Visit | Attending: Family | Admitting: Family

## 2016-03-05 DIAGNOSIS — Z0000 Encounter for general adult medical examination without abnormal findings: Secondary | ICD-10-CM

## 2016-03-05 DIAGNOSIS — Z13820 Encounter for screening for osteoporosis: Secondary | ICD-10-CM

## 2016-03-07 ENCOUNTER — Encounter: Payer: Self-pay | Admitting: Family

## 2016-06-28 ENCOUNTER — Encounter: Payer: Self-pay | Admitting: Family

## 2016-07-05 ENCOUNTER — Other Ambulatory Visit: Payer: Self-pay | Admitting: Obstetrics and Gynecology

## 2016-07-05 DIAGNOSIS — Z1231 Encounter for screening mammogram for malignant neoplasm of breast: Secondary | ICD-10-CM

## 2016-07-05 LAB — HM PAP SMEAR

## 2016-07-08 ENCOUNTER — Ambulatory Visit
Admission: RE | Admit: 2016-07-08 | Discharge: 2016-07-08 | Disposition: A | Discharge status: Home / Self Care | Payer: 59 | Source: Ambulatory Visit | Attending: Obstetrics and Gynecology | Admitting: Obstetrics and Gynecology

## 2016-07-08 DIAGNOSIS — Z1231 Encounter for screening mammogram for malignant neoplasm of breast: Secondary | ICD-10-CM

## 2016-07-09 ENCOUNTER — Encounter: Payer: Self-pay | Admitting: Family

## 2016-07-12 DIAGNOSIS — R0683 Snoring: Secondary | ICD-10-CM | POA: Insufficient documentation

## 2016-08-02 ENCOUNTER — Ambulatory Visit (INDEPENDENT_AMBULATORY_CARE_PROVIDER_SITE_OTHER): Payer: 59 | Admitting: Family

## 2016-08-02 ENCOUNTER — Encounter: Payer: Self-pay | Admitting: Family

## 2016-08-02 VITALS — BP 108/75 | HR 74 | Temp 98.1°F | Resp 16 | Ht 71.0 in | Wt 230.4 lb

## 2016-08-02 DIAGNOSIS — Z86006 Personal history of melanoma in-situ: Secondary | ICD-10-CM

## 2016-08-02 DIAGNOSIS — Z0000 Encounter for general adult medical examination without abnormal findings: Secondary | ICD-10-CM | POA: Diagnosis not present

## 2016-08-02 LAB — CBC WITH DIFFERENTIAL/PLATELET
Basophils Absolute: 0 10*3/uL (ref 0.0–0.1)
Basophils Relative: 0.8 % (ref 0.0–3.0)
EOS PCT: 2.3 % (ref 0.0–5.0)
Eosinophils Absolute: 0.1 10*3/uL (ref 0.0–0.7)
HEMATOCRIT: 37.8 % (ref 36.0–46.0)
HEMOGLOBIN: 12.8 g/dL (ref 12.0–15.0)
LYMPHS ABS: 2.3 10*3/uL (ref 0.7–4.0)
Lymphocytes Relative: 41.1 % (ref 12.0–46.0)
MCHC: 33.9 g/dL (ref 30.0–36.0)
MCV: 86.9 fl (ref 78.0–100.0)
Monocytes Absolute: 0.4 10*3/uL (ref 0.1–1.0)
Monocytes Relative: 6.5 % (ref 3.0–12.0)
NEUTROS PCT: 49.3 % (ref 43.0–77.0)
Neutro Abs: 2.7 10*3/uL (ref 1.4–7.7)
Platelets: 275 10*3/uL (ref 150.0–400.0)
RBC: 4.35 Mil/uL (ref 3.87–5.11)
RDW: 13.5 % (ref 11.5–15.5)
WBC: 5.6 10*3/uL (ref 4.0–10.5)

## 2016-08-02 LAB — URINALYSIS, ROUTINE W REFLEX MICROSCOPIC
BILIRUBIN URINE: NEGATIVE
Hgb urine dipstick: NEGATIVE
KETONES UR: NEGATIVE
NITRITE: NEGATIVE
PH: 6 (ref 5.0–8.0)
RBC / HPF: NONE SEEN (ref 0–?)
SPECIFIC GRAVITY, URINE: 1.02 (ref 1.000–1.030)
TOTAL PROTEIN, URINE-UPE24: NEGATIVE
Urine Glucose: NEGATIVE
Urobilinogen, UA: 0.2 (ref 0.0–1.0)

## 2016-08-02 LAB — LIPID PANEL
CHOLESTEROL: 181 mg/dL (ref 0–200)
HDL: 49.9 mg/dL (ref >39.00)
LDL Cholesterol: 109 mg/dL — ABNORMAL HIGH (ref 0–99)
NONHDL: 130.99
Total CHOL/HDL Ratio: 4
Triglycerides: 110 mg/dL (ref 0.0–149.0)
VLDL: 22 mg/dL (ref 0.0–40.0)

## 2016-08-02 LAB — HEPATIC FUNCTION PANEL
ALBUMIN: 4.2 g/dL (ref 3.5–5.2)
ALT: 19 U/L (ref 0–35)
AST: 19 U/L (ref 0–37)
Alkaline Phosphatase: 121 U/L — ABNORMAL HIGH (ref 39–117)
Bilirubin, Direct: 0.1 mg/dL (ref 0.0–0.3)
TOTAL PROTEIN: 6.8 g/dL (ref 6.0–8.3)
Total Bilirubin: 0.5 mg/dL (ref 0.2–1.2)

## 2016-08-02 LAB — BASIC METABOLIC PANEL
BUN: 19 mg/dL (ref 6–23)
CO2: 31 mEq/L (ref 19–32)
Calcium: 9.4 mg/dL (ref 8.4–10.5)
Chloride: 105 mEq/L (ref 96–112)
Creatinine, Ser: 0.81 mg/dL (ref 0.40–1.20)
GFR: 78.16 mL/min (ref >60.00)
GLUCOSE: 90 mg/dL (ref 70–99)
POTASSIUM: 4.2 meq/L (ref 3.5–5.1)
SODIUM: 141 meq/L (ref 135–145)

## 2016-08-02 LAB — TSH: TSH: 1.96 u[IU]/mL (ref 0.35–4.50)

## 2016-08-02 NOTE — Assessment & Plan Note (Signed)
Discussed counting calories. Continuing regular exercise.  Immunizations  Reviewed and up to date.

## 2016-08-02 NOTE — Assessment & Plan Note (Signed)
Advised pt to schedule follow up with Derm.

## 2016-08-02 NOTE — Progress Notes (Signed)
Subjective:    Patient ID: Karen Cobb, female    DOB: 08/18/1960, 56 y.o.   MRN: [id number]  HPI  Patient presents today for complete physical.  Immunizations: tetanus and flu up to date.  Diet: reports fair diet Exercise: she goes to the gym at least 5 days a week.  Colonoscopy: 2014- due for follow up in 2019 due to family hx. Dexa: 8/17- normal Pap Smear:  12/17 Mammogram: 12/19  She is seeing [facility] opthalmology for an eye issue, has floaters and "some other issues" that they are watching.  She sees them again next month.   Wt Readings from Last 3 Encounters:  08/02/16 230 lb 6.4 oz (104.5 kg)  08/01/15 220 lb 6.4 oz (100 kg)  07/28/13 210 lb (95.3 kg)    Review of Systems  Constitutional: Negative for unexpected weight change.       Does not sleep well.  Snoring.   HENT: Negative for rhinorrhea.   Respiratory: Negative for cough.   Cardiovascular: Negative for leg swelling.  Gastrointestinal: Negative for diarrhea.       Rare constipation  Genitourinary: Negative for dysuria and frequency.  Musculoskeletal: Negative for myalgias.       Reports right shoulder pain- takes aleve every day- sees [facility]. Had a cortisone shot before Cobb  Neurological: Negative for headaches.  Hematological: Negative for adenopathy.  Psychiatric/Behavioral:       Denies depression- has difficult home situation since husband is very depressed       Past Medical History:  Diagnosis Date  . Cancer ([place]) 1999   melanoma  . Hyperlipidemia      Social History   Social History  . Marital status: Married    Spouse name: N/A  . Number of children: N/A  . Years of education: N/A   Occupational History  . Not on file.   Social History Main Topics  . Smoking status: Former Research scientist (life sciences)  . Smokeless tobacco: Never Used  . Alcohol use 0.0 oz/week     Comment: rarely  . Drug use: No  . Sexual activity: Not on file   Other Topics Concern  . Not on file   Social History  Narrative   Works at [employer]   Married   One son- born 1998   One step son, one step daughter (they are grown)   Enjoys going to the [employer].     Completed HS    Past Surgical History:  Procedure Laterality Date  . ADENOIDECTOMY    . MELANOMA EXCISION Right    foot  . TOOTH EXTRACTION    . TUBAL LIGATION      Family History  Problem Relation Age of Onset  . Cancer Mother   . Heart disease Father   . Hypertension Father     No Known Allergies  Current Outpatient Prescriptions on File Prior to Visit  Medication Sig Dispense Refill  . atorvastatin (LIPITOR) 10 MG tablet Take 1 tablet (10 mg total) by mouth daily. 30 tablet 6   No current facility-administered medications on file prior to visit.     BP 108/75 (BP Location: Right Arm, Cuff Size: Large)   Pulse 74   Temp 98.1 F (36.7 C) (Oral)   Resp 16   Ht 5\' 11"  (1.803 m)   Wt 230 lb 6.4 oz (104.5 kg)   SpO2 100% Comment: room air  BMI 32.13 kg/m    Objective:  Physical Exam  Physical Exam  Constitutional: She is oriented to person, place, and time. She appears well-developed and well-nourished. No distress.  HENT:  Head: Normocephalic and atraumatic.  Right Ear: Tympanic membrane and ear canal normal.  Left Ear: Tympanic membrane and ear canal normal.  Mouth/Throat: Oropharynx is clear and moist.  Eyes: Pupils are equal, round, and reactive to light. No scleral icterus.  Neck: Normal range of motion. No thyromegaly present.  Cardiovascular: Normal rate and regular rhythm.   No murmur heard. Pulmonary/Chest: Effort normal and breath sounds normal. No respiratory distress. He has no wheezes. She has no rales. She exhibits no tenderness.  Abdominal: Soft. Bowel sounds are normal. She exhibits no distension and no mass. There is no tenderness. There is no rebound and no guarding.  Musculoskeletal: She exhibits no edema.  Lymphadenopathy:    She has no  cervical adenopathy.  Neurological: She is alert and oriented to person, place, and time. She has normal patellar reflexes. She exhibits normal muscle tone. Coordination normal.  Skin: Skin is warm and dry.  Psychiatric: She has a normal mood and affect. Her behavior is normal. Judgment and thought content normal.  Breasts: Examined lying Right: Without masses, retractions, discharge or axillary adenopathy.  Left: Without masses, retractions, discharge or axillary adenopathy.  Pelvic: deferred        Assessment & Plan:         Assessment & Plan:  EKG tracing is personally reviewed.  EKG notes NSR.  No acute changes.

## 2016-08-02 NOTE — Progress Notes (Signed)
Pre visit review using our clinic review tool, if applicable. No additional management support is needed unless otherwise documented below in the visit note. 

## 2016-08-02 NOTE — Patient Instructions (Addendum)
Please contact dermatology to schedule a follow up skin screening. Try to work on calorie counting to help with weight loss. Keep up the great work with exercise.

## 2016-08-05 ENCOUNTER — Other Ambulatory Visit (INDEPENDENT_AMBULATORY_CARE_PROVIDER_SITE_OTHER): Payer: 59

## 2016-08-05 ENCOUNTER — Encounter: Payer: Self-pay | Admitting: Family

## 2016-08-05 DIAGNOSIS — Z0000 Encounter for general adult medical examination without abnormal findings: Secondary | ICD-10-CM

## 2016-08-05 DIAGNOSIS — E559 Vitamin D deficiency, unspecified: Secondary | ICD-10-CM

## 2016-08-05 LAB — VITAMIN D 25 HYDROXY (VIT D DEFICIENCY, FRACTURES): VITD: 25.68 ng/mL — AB (ref 30.00–100.00)

## 2016-08-06 DIAGNOSIS — E559 Vitamin D deficiency, unspecified: Secondary | ICD-10-CM | POA: Insufficient documentation

## 2016-08-06 MED ORDER — VITAMIN D (ERGOCALCIFEROL) 1.25 MG (50000 UNIT) PO CAPS: 50000.0000 [IU] | ORAL_CAPSULE | ORAL | 0 refills | Status: DC

## 2016-08-06 NOTE — Telephone Encounter (Signed)
Pt wants to know if it is ok to continue the caltrate + D that she has been taking in addition to the prescription strength Vitamin D? Notified pt and she voices understanding. Rx sent to pharmacy. Lab appt scheduled for 10/29/16 at 8am and future lab order entered.

## 2016-08-06 NOTE — Telephone Encounter (Signed)
Vitamin D level is low.  Advise patient to begin vit D 50000 units once weekly for 12 weeks, then repeat vit D level (dx Vit D deficiency).     

## 2016-09-02 DIAGNOSIS — H43812 Vitreous degeneration, left eye: Secondary | ICD-10-CM | POA: Diagnosis not present

## 2016-09-03 DIAGNOSIS — G4733 Obstructive sleep apnea (adult) (pediatric): Secondary | ICD-10-CM | POA: Diagnosis not present

## 2016-09-11 DIAGNOSIS — M7541 Impingement syndrome of right shoulder: Secondary | ICD-10-CM | POA: Diagnosis not present

## 2016-09-15 ENCOUNTER — Encounter: Payer: Self-pay | Admitting: Family

## 2016-09-16 DIAGNOSIS — M7541 Impingement syndrome of right shoulder: Secondary | ICD-10-CM | POA: Diagnosis not present

## 2016-09-19 HISTORY — PX: SHOULDER SURGERY: SHX246

## 2016-09-24 ENCOUNTER — Other Ambulatory Visit: Payer: Self-pay | Admitting: Family

## 2016-10-01 DIAGNOSIS — R600 Localized edema: Secondary | ICD-10-CM | POA: Diagnosis not present

## 2016-10-01 DIAGNOSIS — G8918 Other acute postprocedural pain: Secondary | ICD-10-CM | POA: Diagnosis not present

## 2016-10-01 DIAGNOSIS — M24111 Other articular cartilage disorders, right shoulder: Secondary | ICD-10-CM | POA: Diagnosis not present

## 2016-10-01 DIAGNOSIS — S43431A Superior glenoid labrum lesion of right shoulder, initial encounter: Secondary | ICD-10-CM | POA: Diagnosis not present

## 2016-10-01 DIAGNOSIS — M7541 Impingement syndrome of right shoulder: Secondary | ICD-10-CM | POA: Diagnosis not present

## 2016-10-01 DIAGNOSIS — M19011 Primary osteoarthritis, right shoulder: Secondary | ICD-10-CM | POA: Diagnosis not present

## 2016-10-08 ENCOUNTER — Ambulatory Visit: Payer: 59 | Attending: Orthopedic Surgery | Admitting: Physical Therapy

## 2016-10-08 ENCOUNTER — Encounter: Payer: Self-pay | Admitting: Physical Therapy

## 2016-10-08 DIAGNOSIS — M25611 Stiffness of right shoulder, not elsewhere classified: Secondary | ICD-10-CM

## 2016-10-08 DIAGNOSIS — M25511 Pain in right shoulder: Secondary | ICD-10-CM

## 2016-10-08 DIAGNOSIS — R2231 Localized swelling, mass and lump, right upper limb: Secondary | ICD-10-CM

## 2016-10-08 NOTE — Therapy (Signed)
[place], [place], [address] Phone: [phone]   Fax:  [phone]  Physical Therapy Evaluation  Patient Details  Name: Karen Cobb MRN: [id number] Date of Birth: 19-Aug-1960 Referring Provider: Onnie Graham  Encounter Date: 10/08/2016      PT End of Session - 10/08/16 1104    Visit Number 1   Date for PT Re-Evaluation 10/08/16   PT Start Time 1016   PT Stop Time 1105   PT Time Calculation (min) 49 min   Activity Tolerance Patient tolerated treatment well   Behavior During Therapy [facility] for tasks assessed/performed      Past Medical History:  Diagnosis Date  . Cancer ([place]) 1999   melanoma  . Hyperlipidemia     Past Surgical History:  Procedure Laterality Date  . ADENOIDECTOMY    . MELANOMA EXCISION Right    foot  . TOOTH EXTRACTION    . TUBAL LIGATION      There were no vitals filed for this visit.       Subjective Assessment - 10/08/16 1022    Subjective Patient reports that she had been having right shoulder pain for about 3 years, she had injections and took medication to prolong her ability.  She reports that about 2 motnhs ago she got to the point that she could not sleep and had increased difficulty with all ADL's.  She underwent a right shoulder DCR and SAD on 10/01/16   Limitations Lifting;House hold activities   Patient Stated Goals have normal ROM and minimal pain   Currently in Pain? Yes   Pain Score 1    Pain Location Shoulder   Pain Orientation Right   Pain Descriptors / Indicators Aching;Sore   Pain Type Surgical pain   Pain Onset In the past 7 days   Pain Frequency Constant   Aggravating Factors  reaching, dressing, washing hair. reaching out pain up to 6/10, c/o tightness   Pain Relieving Factors rest   Effect of Pain on Daily Activities pain with ADL's            [facility] PT Assessment - 10/08/16 0001      Assessment   Medical Diagnosis left shoulder SAD  and DCR   Referring Provider Supple   Onset Date/Surgical Date 10/01/16   Hand Dominance Right   Prior Therapy no     Precautions   Precautions None     Balance Screen   Has the patient fallen in the past 6 months No   Has the patient had a decrease in activity level because of a fear of falling?  No   Is the patient reluctant to leave their home because of a fear of falling?  No     Home Environment   Additional Comments does her own housework and yardwork     Prior Function   Level of Independence Independent   Vocation Full time employment   Vocation Requirements some reaching and lifting     Posture/Postural Control   Posture Comments mild fwd head and rounded shoulders     ROM / Strength   AROM / PROM / Strength AROM;PROM;Strength     AROM   AROM Assessment Site Shoulder   Right/Left Shoulder Right   Right Shoulder Flexion 140 Degrees   Right Shoulder ABduction 140 Degrees   Right Shoulder Internal Rotation 40 Degrees   Right Shoulder External Rotation 80 Degrees     PROM  PROM Assessment Site Shoulder   Right/Left Shoulder Right   Right Shoulder Flexion 170 Degrees   Right Shoulder ABduction 170 Degrees   Right Shoulder Internal Rotation 50 Degrees   Right Shoulder External Rotation 85 Degrees     Strength   Overall Strength Comments 3+/5 in the available ROM with mild c/o pain and pressure     Palpation   Palpation comment has a large area of ecchymosis under the right arm and medial upper arm, mild tenderness in the right shoulder, tightness in the upper trap                   [facility] Adult PT Treatment/Exercise - 10/08/16 0001      Exercises   Exercises Shoulder     Shoulder Exercises: ROM/Strengthening   UBE (Upper Arm Bike) Level 2 x 4 minutes   Cybex Row 2 plate;20 reps   Cybex Row Limitations focus on scapular retraction   Pendulum 10 each direction                PT Education - 10/08/16 1104    Education provided Yes    Education Details Started PROM/AAROM   Person(s) Educated Patient   Methods Explanation;Demonstration;Handout   Comprehension Verbalized understanding;Returned demonstration          PT Short Term Goals - 10/08/16 1110      PT SHORT TERM GOAL #1   Title independent with initial HEP   Time 2   Period Weeks   Status New           PT Long Term Goals - 10/08/16 1110      PT LONG TERM GOAL #1   Title understand and perform RICE   Time 8   Period Weeks   Status New     PT LONG TERM GOAL #2   Title increase AROM to WNL's   Time 8   Period Weeks   Status New     PT LONG TERM GOAL #3   Title do bra in back without difficulty   Time 8   Period Weeks   Status New     PT LONG TERM GOAL #4   Title return to work and gym without issue   Time 8   Period Weeks   Status New     PT LONG TERM GOAL #5   Title no pain during ADL's   Time 8   Period Weeks   Status New               Plan - 10/08/16 1105    Clinical Impression Statement Pateint reports that she has had some pain in the right shoulder for about 2-3 years.  She reports a lot of incresaed pain over the last 2 months to the point she could not reach and had difficulty with ADL's.  She underwent a right shoulder SAD/DCR on 10/01/16.  She has swelling and ecchymosis of the right upper arm.  She had very good ROM most limited with IR.  She plans to go back to work on Monday.   Rehab Potential Good   PT Frequency 2x / week   PT Duration 8 weeks   PT Treatment/Interventions ADLs/Self Care Home Management;Cryotherapy;Electrical Stimulation;Therapeutic activities;Therapeutic exercise;Neuromuscular re-education;Patient/family education;Vasopneumatic Device;Manual techniques   PT Next Visit Plan She is doing very well cautioned her on over doing it.  We will slowly progress her exercises   Consulted and Agree with Plan of Care Patient  Patient will benefit from skilled therapeutic intervention in order to  improve the following deficits and impairments:  Decreased range of motion, Decreased strength, Increased edema, Increased muscle spasms, Increased fascial restricitons, Impaired flexibility, Postural dysfunction, Improper body mechanics, Pain, Impaired UE functional use  Visit Diagnosis: Acute pain of right shoulder - Plan: PT plan of care cert/re-cert  Stiffness of right shoulder, not elsewhere classified - Plan: PT plan of care cert/re-cert  Localized swelling, mass and lump, right upper limb - Plan: PT plan of care cert/re-cert     Problem List Patient Active Problem List   Diagnosis Date Noted  . Vitamin D deficiency 08/06/2016  . Hx of melanoma in situ 08/01/2015  . Hyperlipidemia 08/01/2015  . Preventative health care 08/01/2015    Sumner Boast., PT 10/08/2016, 11:13 AM  [place], [place], [address] Phone: [phone]   Fax:  [phone]  Name: Karen Cobb MRN: [id number] Date of Birth: 06/13/1961

## 2016-10-17 ENCOUNTER — Ambulatory Visit: Payer: 59 | Admitting: Physical Therapy

## 2016-10-17 ENCOUNTER — Encounter: Payer: Self-pay | Admitting: Physical Therapy

## 2016-10-17 DIAGNOSIS — R2231 Localized swelling, mass and lump, right upper limb: Secondary | ICD-10-CM

## 2016-10-17 DIAGNOSIS — M25511 Pain in right shoulder: Secondary | ICD-10-CM

## 2016-10-17 DIAGNOSIS — M25611 Stiffness of right shoulder, not elsewhere classified: Secondary | ICD-10-CM

## 2016-10-17 NOTE — Therapy (Signed)
[place], [place], [address] Phone: [phone]   Fax:  [phone]  Physical Therapy Treatment  Patient Details  Name: Karen Cobb MRN: [id number] Date of Birth: [birth year] Referring Provider: Onnie Graham  Encounter Date: 10/17/2016      PT End of Session - 10/17/16 1600    Visit Number 2   Date for PT Re-Evaluation 12/08/16   PT Start Time 1528   PT Stop Time 1610   PT Time Calculation (min) 42 min   Activity Tolerance Patient tolerated treatment well   Behavior During Therapy [facility] for tasks assessed/performed      Past Medical History:  Diagnosis Date  . Cancer ([place]) 1999   melanoma  . Hyperlipidemia     Past Surgical History:  Procedure Laterality Date  . ADENOIDECTOMY    . MELANOMA EXCISION Right    foot  . TOOTH EXTRACTION    . TUBAL LIGATION      There were no vitals filed for this visit.      Subjective Assessment - 10/17/16 1532    Subjective Reports that she went back to work on Monday, some pain in the upper trap, "tgihtness".  She went to the gym and tried to do what we did last week.   Currently in Pain? Yes   Pain Score 2    Pain Location Shoulder   Pain Orientation Right                         [facility] Adult PT Treatment/Exercise - 10/17/16 0001      Shoulder Exercises: ROM/Strengthening   UBE (Upper Arm Bike) Level 4 x5 minutes   Cybex Row 2 plate;20 reps   Cybex Row Limitations focus on scapular retraction   Wall Wash 20 reps   Wall Pushups 20 reps   Pushups Limitations close to the wall   Other ROM/Strengthening Exercises elbow on the big blue ball 3# ER, green tband IR   Other ROM/Strengthening Exercises 10# biceps, 35# triceps, green tband ER/IR and two way scapular stabilization     Shoulder Exercises: Stretch   Corner Stretch 3 reps;10 seconds   Star Gazer Stretch 3 reps;20 seconds                  PT Short Term Goals -  10/17/16 1643      PT SHORT TERM GOAL #1   Title independent with initial HEP   Status Achieved           PT Long Term Goals - 10/17/16 1644      PT LONG TERM GOAL #1   Title understand and perform RICE   Status Achieved               Plan - 10/17/16 1600    Clinical Impression Statement Patient is doing great, again I am concerned about her doing too much, so I have cautioned her to go slow and continue to ice and not lift anything heavy away from her body or over head   PT Next Visit Plan had her cancel next week visit due to doing great, great ROM and no pain, will see in 2 weeks   Consulted and Agree with Plan of Care Patient      Patient will benefit from skilled therapeutic intervention in order to improve the following deficits and impairments:  Decreased range of motion, Decreased strength, Increased edema, Increased  muscle spasms, Increased fascial restricitons, Impaired flexibility, Postural dysfunction, Improper body mechanics, Pain, Impaired UE functional use  Visit Diagnosis: Acute pain of right shoulder  Stiffness of right shoulder, not elsewhere classified  Localized swelling, mass and lump, right upper limb     Problem List Patient Active Problem List   Diagnosis Date Noted  . Vitamin D deficiency 08/06/2016  . Hx of melanoma in situ 08/01/2015  . Hyperlipidemia 08/01/2015  . Preventative health care 08/01/2015    Sumner Boast., PT 10/17/2016, 4:44 PM  [place], [place], [address] Phone: [phone]   Fax:  [phone]  Name: Karen Cobb MRN: [id number] Date of Birth: Mar 11, 1961

## 2016-10-23 ENCOUNTER — Ambulatory Visit: Payer: 59 | Admitting: Physical Therapy

## 2016-10-28 ENCOUNTER — Ambulatory Visit: Payer: 59 | Attending: Orthopedic Surgery | Admitting: Physical Therapy

## 2016-10-28 ENCOUNTER — Encounter: Payer: Self-pay | Admitting: Physical Therapy

## 2016-10-28 DIAGNOSIS — M25611 Stiffness of right shoulder, not elsewhere classified: Secondary | ICD-10-CM | POA: Diagnosis present

## 2016-10-28 DIAGNOSIS — R2231 Localized swelling, mass and lump, right upper limb: Secondary | ICD-10-CM | POA: Insufficient documentation

## 2016-10-28 DIAGNOSIS — M25511 Pain in right shoulder: Secondary | ICD-10-CM | POA: Diagnosis present

## 2016-10-28 NOTE — Therapy (Signed)
[place] [place], [place], [address] Phone: [phone]   Fax:  [phone]  Physical Therapy Treatment  Patient Details  Name: Karen Cobb MRN: [id number] Date of Birth: July 08, 1961 Referring Provider: Onnie Graham  Encounter Date: 10/28/2016      PT End of Session - 10/28/16 1515    Visit Number 3   Date for PT Re-Evaluation 12/08/16   PT Start Time 1430   PT Stop Time 1515   PT Time Calculation (min) 45 min   Activity Tolerance Patient tolerated treatment well   Behavior During Therapy [facility] for tasks assessed/performed      Past Medical History:  Diagnosis Date  . Cancer ([place]) 1999   melanoma  . Hyperlipidemia     Past Surgical History:  Procedure Laterality Date  . ADENOIDECTOMY    . MELANOMA EXCISION Right    foot  . TOOTH EXTRACTION    . TUBAL LIGATION      There were no vitals filed for this visit.      Subjective Assessment - 10/28/16 1430    Subjective Reports that she has been back to work x 2 weeks, a little increase in feeling when she is lifting the larger items at work, reports had a little pain with opening a car door and the wind caught it.  Has tried back at the gym and her only concern is popping   Currently in Pain? No/denies   Aggravating Factors  currently only having pain at night and in the AM pain a 5/10                         [facility] Adult PT Treatment/Exercise - 10/28/16 0001      Shoulder Exercises: Seated   Other Seated Exercises 3# bent over rows, flies and extensions     Shoulder Exercises: Standing   External Rotation 20 reps;Theraband   Theraband Level (Shoulder External Rotation) Level 2 (Red)   Internal Rotation Right;20 reps;Theraband   Theraband Level (Shoulder Internal Rotation) Level 2 (Red)   Other Standing Exercises standing 2# D2 PNF     Shoulder Exercises: ROM/Strengthening   UBE (Upper Arm Bike) constant work 30 watts x 6  minutes   Cybex Row 3 plate   [employer] 20 reps   Wall Pushups 20 reps   Pushups Limitations close to the wall   "W" Arms 20 reps   Other ROM/Strengthening Exercises elbow on the big blue ball 3# ER, green tband IR, weighted ball rythmic stabilization   Other ROM/Strengthening Exercises 10# biceps, 35# triceps, green tband ER/IR and two way scapular stabilization                PT Education - 10/28/16 1514    Education provided Yes   Education Details reviewed form in the gym, needs cues for scapular retraction to set the shoulder and to keep elbows in for many of the exercises   Person(s) Educated Patient   Methods Explanation;Demonstration;Tactile cues;Verbal cues   Comprehension Verbalized understanding;Returned demonstration;Tactile cues required;Verbal cues required          PT Short Term Goals - 10/17/16 1643      PT SHORT TERM GOAL #1   Title independent with initial HEP   Status Achieved           PT Long Term Goals - 10/28/16 1520      PT LONG TERM GOAL #1  Title understand and perform RICE   Status Achieved     PT LONG TERM GOAL #2   Title increase AROM to WNL's   Status Achieved     PT LONG TERM GOAL #3   Title do bra in back without difficulty   Status Achieved     PT LONG TERM GOAL #4   Title return to work and gym without issue   Status Partially Met     PT LONG TERM GOAL #5   Title no pain during ADL's   Status Partially Met               Plan - 10/28/16 1516    Clinical Impression Statement Patient continues to do well, some quesitons about popping in the shoulder, just needed verbal and tactile cues to retract the scapula and to keep elbows in and this decreased the popping, wnt over how she should continue to progress slowly and await the MD okay to do any pushing especially overhead, I advised for her to speak with the MD or wait at least 8 weeks post op   PT Next Visit Plan I will put her on hold and really cautioned her  to go slow and work on form, not weight   Consulted and Agree with Plan of Care Patient      Patient will benefit from skilled therapeutic intervention in order to improve the following deficits and impairments:  Decreased range of motion, Decreased strength, Increased edema, Increased muscle spasms, Increased fascial restricitons, Impaired flexibility, Postural dysfunction, Improper body mechanics, Pain, Impaired UE functional use  Visit Diagnosis: Acute pain of right shoulder  Stiffness of right shoulder, not elsewhere classified  Localized swelling, mass and lump, right upper limb     Problem List Patient Active Problem List   Diagnosis Date Noted  . Vitamin D deficiency 08/06/2016  . Hx of melanoma in situ 08/01/2015  . Hyperlipidemia 08/01/2015  . Preventative health care 08/01/2015    Sumner Boast., PT 10/28/2016, 3:21 PM  [address] [place], [place], [address] Phone: [phone]   Fax:  [phone]  Name: Karen Cobb MRN: [id number] Date of Birth: 1961-03-31

## 2016-10-29 ENCOUNTER — Other Ambulatory Visit (INDEPENDENT_AMBULATORY_CARE_PROVIDER_SITE_OTHER): Payer: 59

## 2016-10-29 DIAGNOSIS — E559 Vitamin D deficiency, unspecified: Secondary | ICD-10-CM | POA: Diagnosis not present

## 2016-10-29 LAB — VITAMIN D 25 HYDROXY (VIT D DEFICIENCY, FRACTURES): VITD: 42.83 ng/mL (ref 30.00–100.00)

## 2016-10-30 ENCOUNTER — Other Ambulatory Visit: Payer: Self-pay | Admitting: Family

## 2016-10-30 MED ORDER — VITAMIN D3 75 MCG (3000 UT) PO TABS: 1.0000 | ORAL_TABLET | Freq: Every day | ORAL | Status: DC

## 2016-10-30 MED ORDER — VITAMIN D (ERGOCALCIFEROL) 1.25 MG (50000 UNIT) PO CAPS: 50000.0000 [IU] | ORAL_CAPSULE | ORAL | 0 refills | Status: DC

## 2016-10-30 NOTE — Progress Notes (Signed)
Follow up vit D is normal. D/c weekly vit d. Begin 3000 units daily otc.

## 2016-10-31 NOTE — Progress Notes (Signed)
Patient informed, understood & agreed/SLS 04/12

## 2016-11-27 DIAGNOSIS — Z85820 Personal history of malignant melanoma of skin: Secondary | ICD-10-CM | POA: Diagnosis not present

## 2016-11-27 DIAGNOSIS — D229 Melanocytic nevi, unspecified: Secondary | ICD-10-CM | POA: Diagnosis not present

## 2016-11-27 DIAGNOSIS — L821 Other seborrheic keratosis: Secondary | ICD-10-CM | POA: Diagnosis not present

## 2017-01-28 ENCOUNTER — Other Ambulatory Visit: Payer: Self-pay | Admitting: Family

## 2017-01-28 DIAGNOSIS — E559 Vitamin D deficiency, unspecified: Secondary | ICD-10-CM

## 2017-01-28 NOTE — Telephone Encounter (Signed)
Pt requesting refill of Vitamin D 50,000. Last vit d level 10/29/16. Ok to refill or need another vit D check?

## 2017-01-28 NOTE — Telephone Encounter (Signed)
Rx denial sent to pharmacy and mychart message sent to pt re: need to schedule lab appt. Future lab order entered.

## 2017-01-28 NOTE — Telephone Encounter (Signed)
Have her return to complete a follow-up vitamin D level first. We may be able to switch her to an over-the-counter dosing. Thanks

## 2017-03-08 ENCOUNTER — Other Ambulatory Visit: Payer: Self-pay | Admitting: Family

## 2017-04-22 DIAGNOSIS — Z23 Encounter for immunization: Secondary | ICD-10-CM | POA: Diagnosis not present

## 2017-06-03 DIAGNOSIS — H43812 Vitreous degeneration, left eye: Secondary | ICD-10-CM | POA: Diagnosis not present

## 2017-06-03 DIAGNOSIS — H524 Presbyopia: Secondary | ICD-10-CM | POA: Diagnosis not present

## 2017-06-06 ENCOUNTER — Encounter: Payer: Self-pay | Admitting: Family

## 2017-06-06 ENCOUNTER — Ambulatory Visit (INDEPENDENT_AMBULATORY_CARE_PROVIDER_SITE_OTHER): Payer: 59 | Admitting: Family

## 2017-06-06 VITALS — BP 117/76 | HR 88 | Temp 97.9°F | Resp 16 | Ht 71.0 in | Wt 232.4 lb

## 2017-06-06 DIAGNOSIS — E785 Hyperlipidemia, unspecified: Secondary | ICD-10-CM | POA: Diagnosis not present

## 2017-06-06 DIAGNOSIS — K219 Gastro-esophageal reflux disease without esophagitis: Secondary | ICD-10-CM | POA: Diagnosis not present

## 2017-06-06 DIAGNOSIS — F418 Other specified anxiety disorders: Secondary | ICD-10-CM | POA: Diagnosis not present

## 2017-06-06 MED ORDER — OMEPRAZOLE 20 MG PO CPDR: 20.0000 mg | DELAYED_RELEASE_CAPSULE | Freq: Every day | ORAL | 5 refills | Status: DC

## 2017-06-06 NOTE — Patient Instructions (Signed)
Continue prilosec.  Please contact [facility] to schedule an appointment with a therapist-  [phone]

## 2017-06-06 NOTE — Assessment & Plan Note (Signed)
Stable on PPI, continue same, discussed gerd diet.

## 2017-06-06 NOTE — Progress Notes (Signed)
Subjective:    Patient ID: Karen Cobb, female    DOB: August 02, 1960, 56 y.o.   MRN: [id number]  HPI   GERD-  Takes omeprazole 20mg  once daily. Reports that this controls her symptoms.  Anxiety- reports recent increased anxiety because her mother was diagnosed with metastatic colon cancer. Feels overwhelmed by her impending decline in healthy.    Hyperlipidemia- maintained on lipitor 10mg .   Lab Results  Component Value Date   CHOL 181 08/02/2016   HDL 49.90 08/02/2016   LDLCALC 109 (H) 08/02/2016   TRIG 110.0 08/02/2016   CHOLHDL 4 08/02/2016     Review of Systems See HPI  Past Medical History:  Diagnosis Date  . Cancer ([place]) 1999   melanoma  . Hyperlipidemia      Social History   Socioeconomic History  . Marital status: Married    Spouse name: Not on file  . Number of children: Not on file  . Years of education: Not on file  . Highest education level: Not on file  Social Needs  . Financial resource strain: Not on file  . Food insecurity - worry: Not on file  . Food insecurity - inability: Not on file  . Transportation needs - medical: Not on file  . Transportation needs - non-medical: Not on file  Occupational History  . Not on file  Tobacco Use  . Smoking status: Former Research scientist (life sciences)  . Smokeless tobacco: Never Used  Substance and Sexual Activity  . Alcohol use: Yes    Alcohol/week: 0.0 oz    Comment: rarely  . Drug use: No  . Sexual activity: Not on file  Other Topics Concern  . Not on file  Social History Narrative   Works at [employer]   Married   One son- born 1998   One step son, one step daughter (they are grown)   Enjoys going to the [employer].     Completed HS    Past Surgical History:  Procedure Laterality Date  . ADENOIDECTOMY    . MELANOMA EXCISION Right    foot  . TOOTH EXTRACTION    . TUBAL LIGATION      Family History  Problem Relation Age of Onset  . Cancer Mother        colon CA  and uterine cancer 2015  . Heart disease Father        CABG x 4   . Hypertension Father   . CVA Father        "mini strokes"  . AAA (abdominal aortic aneurysm) Father        s/p stent    No Known Allergies  Current Outpatient Medications on File Prior to Visit  Medication Sig Dispense Refill  . calcium citrate-vitamin D (CITRACAL+D) 315-200 MG-UNIT tablet Take 1 tablet 1 day or 1 dose by mouth.    Marland Kitchen atorvastatin (LIPITOR) 10 MG tablet TAKE 1 TABLET (10 MG TOTAL) BY MOUTH DAILY. 30 tablet 5  . B Complex-Biotin-FA (SUPER B-COMPLEX PO) Take 1 tablet by mouth daily.    . Calcium Carbonate-Vitamin D (CALTRATE 600+D PO) Take 1 tablet by mouth daily.    . Multiple Vitamin (MULTIVITAMIN) tablet Take 1 tablet by mouth daily.     No current facility-administered medications on file prior to visit.     BP 117/76 (BP Location: Left Arm, Patient Position: Sitting, Cuff Size: Normal)   Pulse 88   Temp 97.9 F (36.6 C) (Oral)  Resp 16   Ht 5\' 11"  (1.803 m)   Wt 232 lb 6.4 oz (105.4 kg)   SpO2 99%   BMI 32.41 kg/m       Objective:   Physical Exam  Constitutional: She is oriented to person, place, and time. She appears well-developed and well-nourished. No distress.  Neurological: She is alert and oriented to person, place, and time.  Psychiatric: Her behavior is normal. Judgment and thought content normal.  tearful          Assessment & Plan:  Situational anxiety- suggested that she establish with a counselor- information provided for scheduling.

## 2017-06-06 NOTE — Assessment & Plan Note (Signed)
Stable on statin, plan to repeat lipids at upcoming physical.

## 2017-06-16 DIAGNOSIS — L570 Actinic keratosis: Secondary | ICD-10-CM | POA: Diagnosis not present

## 2017-07-07 DIAGNOSIS — Z800 Family history of malignant neoplasm of digestive organs: Secondary | ICD-10-CM | POA: Diagnosis not present

## 2017-08-04 ENCOUNTER — Encounter: Payer: 59 | Admitting: Family

## 2017-08-08 ENCOUNTER — Ambulatory Visit (INDEPENDENT_AMBULATORY_CARE_PROVIDER_SITE_OTHER): Payer: 59 | Admitting: Family

## 2017-08-08 ENCOUNTER — Encounter: Payer: Self-pay | Admitting: Family

## 2017-08-08 VITALS — BP 121/81 | HR 77 | Temp 98.3°F | Resp 16 | Ht 71.0 in | Wt 233.0 lb

## 2017-08-08 DIAGNOSIS — Z0000 Encounter for general adult medical examination without abnormal findings: Secondary | ICD-10-CM

## 2017-08-08 LAB — URINALYSIS, ROUTINE W REFLEX MICROSCOPIC
BILIRUBIN URINE: NEGATIVE
Hgb urine dipstick: NEGATIVE
KETONES UR: NEGATIVE
Leukocytes, UA: NEGATIVE
NITRITE: NEGATIVE
PH: 5 (ref 5.0–8.0)
RBC / HPF: NONE SEEN (ref 0–?)
Specific Gravity, Urine: 1.025 (ref 1.000–1.030)
TOTAL PROTEIN, URINE-UPE24: NEGATIVE
UROBILINOGEN UA: 0.2 (ref 0.0–1.0)
Urine Glucose: NEGATIVE

## 2017-08-08 LAB — LIPID PANEL
CHOLESTEROL: 181 mg/dL (ref 0–200)
HDL: 47.6 mg/dL (ref >39.00)
LDL Cholesterol: 113 mg/dL — ABNORMAL HIGH (ref 0–99)
NonHDL: 133.87
Total CHOL/HDL Ratio: 4
Triglycerides: 106 mg/dL (ref 0.0–149.0)
VLDL: 21.2 mg/dL (ref 0.0–40.0)

## 2017-08-08 LAB — BASIC METABOLIC PANEL
BUN: 17 mg/dL (ref 6–23)
CO2: 31 mEq/L (ref 19–32)
CREATININE: 0.78 mg/dL (ref 0.40–1.20)
Calcium: 9.2 mg/dL (ref 8.4–10.5)
Chloride: 105 mEq/L (ref 96–112)
GFR: 81.33 mL/min (ref >60.00)
Glucose, Bld: 91 mg/dL (ref 70–99)
Potassium: 4.2 mEq/L (ref 3.5–5.1)
Sodium: 140 mEq/L (ref 135–145)

## 2017-08-08 LAB — CBC WITH DIFFERENTIAL/PLATELET
Basophils Absolute: 0 10*3/uL (ref 0.0–0.1)
Basophils Relative: 0.9 % (ref 0.0–3.0)
Eosinophils Absolute: 0.2 10*3/uL (ref 0.0–0.7)
Eosinophils Relative: 3 % (ref 0.0–5.0)
HEMATOCRIT: 38.1 % (ref 36.0–46.0)
Hemoglobin: 12.7 g/dL (ref 12.0–15.0)
Lymphocytes Relative: 34.1 % (ref 12.0–46.0)
Lymphs Abs: 1.7 10*3/uL (ref 0.7–4.0)
MCHC: 33.3 g/dL (ref 30.0–36.0)
MCV: 87.7 fl (ref 78.0–100.0)
MONO ABS: 0.4 10*3/uL (ref 0.1–1.0)
Monocytes Relative: 7.1 % (ref 3.0–12.0)
Neutro Abs: 2.8 10*3/uL (ref 1.4–7.7)
Neutrophils Relative %: 54.9 % (ref 43.0–77.0)
PLATELETS: 247 10*3/uL (ref 150.0–400.0)
RBC: 4.35 Mil/uL (ref 3.87–5.11)
RDW: 13.8 % (ref 11.5–15.5)
WBC: 5.1 10*3/uL (ref 4.0–10.5)

## 2017-08-08 LAB — TSH: TSH: 3.26 u[IU]/mL (ref 0.35–4.50)

## 2017-08-08 LAB — HEPATIC FUNCTION PANEL
ALBUMIN: 4.2 g/dL (ref 3.5–5.2)
ALK PHOS: 109 U/L (ref 39–117)
ALT: 16 U/L (ref 0–35)
AST: 19 U/L (ref 0–37)
Bilirubin, Direct: 0.1 mg/dL (ref 0.0–0.3)
TOTAL PROTEIN: 6.6 g/dL (ref 6.0–8.3)
Total Bilirubin: 0.5 mg/dL (ref 0.2–1.2)

## 2017-08-08 MED ORDER — ATORVASTATIN CALCIUM 10 MG PO TABS: 10.0000 mg | ORAL_TABLET | Freq: Every day | ORAL | 11 refills | Status: DC

## 2017-08-08 NOTE — Patient Instructions (Signed)
Please complete lab work prior to leaving. Schedule mammogram on the first floor.  Continue to work on healthy diet, exercise and weight loss.  

## 2017-08-08 NOTE — Progress Notes (Signed)
Subjective:    Patient ID: Karen Cobb, female    DOB: 1961-04-19, 57 y.o.   MRN: [id number]  HPI   Patient presents today for complete physical.  Immunizations: tetanus 2017 Diet:  Diet is fair Exercise: 3-4 days a week at the [facility] Colonoscopy: 12/17 Dexa: 8/17 Pap Smear: 12/17 Mammogram: 12/17 Dental: up to date Vision: 12/17 Wt Readings from Last 3 Encounters:  08/08/17 233 lb (105.7 kg)  06/06/17 232 lb 6.4 oz (105.4 kg)  08/02/16 230 lb 6.4 oz (104.5 kg)         Review of Systems  Constitutional: Negative for unexpected weight change.  HENT: Positive for rhinorrhea. Negative for hearing loss.   Eyes: Negative for visual disturbance.  Respiratory: Negative for cough.   Gastrointestinal: Negative for blood in stool, constipation and diarrhea.  Genitourinary: Negative for dysuria and frequency.  Musculoskeletal: Negative for arthralgias and myalgias.  Skin: Negative for rash.  Neurological: Negative for headaches.  Hematological: Negative for adenopathy.  Psychiatric/Behavioral:       Denies depression/anxiety   Past Medical History:  Diagnosis Date  . Cancer ([place]) 1999   melanoma  . Hyperlipidemia      Social History   Socioeconomic History  . Marital status: Married    Spouse name: Not on file  . Number of children: Not on file  . Years of education: Not on file  . Highest education level: Not on file  Social Needs  . Financial resource strain: Not on file  . Food insecurity - worry: Not on file  . Food insecurity - inability: Not on file  . Transportation needs - medical: Not on file  . Transportation needs - non-medical: Not on file  Occupational History  . Not on file  Tobacco Use  . Smoking status: Former Research scientist (life sciences)  . Smokeless tobacco: Never Used  Substance and Sexual Activity  . Alcohol use: Yes    Alcohol/week: 0.0 oz    Comment: rarely  . Drug use: No  . Sexual activity: Yes  Other Topics Concern  . Not on file  Social History  Narrative   Works at [employer]   Married   One son- born 1998   One step son, one step daughter (they are grown)   Enjoys going to the [employer].     Completed HS    Past Surgical History:  Procedure Laterality Date  . ADENOIDECTOMY    . MELANOMA EXCISION Right    foot  . SHOULDER SURGERY Right 09/2016  . TOOTH EXTRACTION    . TUBAL LIGATION      Family History  Problem Relation Age of Onset  . Cancer Mother        colon CA and uterine cancer 2015  . Heart disease Father        CABG x 4   . Hypertension Father   . CVA Father        "mini strokes"  . AAA (abdominal aortic aneurysm) Father        s/p stent    No Known Allergies  Current Outpatient Medications on File Prior to Visit  Medication Sig Dispense Refill  . atorvastatin (LIPITOR) 10 MG tablet TAKE 1 TABLET (10 MG TOTAL) BY MOUTH DAILY. 30 tablet 5  . B Complex-Biotin-FA (SUPER B-COMPLEX PO) Take 1 tablet by mouth daily.    . Calcium Carbonate-Vitamin D (CALTRATE 600+D PO) Take 1 tablet by mouth daily.    Marland Kitchen  calcium citrate-vitamin D (CITRACAL+D) 315-200 MG-UNIT tablet Take 1 tablet 1 day or 1 dose by mouth.    . Multiple Vitamin (MULTIVITAMIN) tablet Take 1 tablet by mouth daily.    Marland Kitchen omeprazole (PRILOSEC) 20 MG capsule Take 1 capsule (20 mg total) daily by mouth. 30 capsule 5   No current facility-administered medications on file prior to visit.     BP 121/81 (BP Location: Left Arm, Patient Position: Sitting, Cuff Size: Large)   Pulse 77   Temp 98.3 F (36.8 C) (Oral)   Resp 16   Ht 5\' 11"  (1.803 m)   Wt 233 lb (105.7 kg)   SpO2 99%   BMI 32.50 kg/m        Objective:   Physical Exam  Physical Exam  Constitutional: She is oriented to person, place, and time. She appears well-developed and well-nourished. No distress.  HENT:  Head: Normocephalic and atraumatic.  Right Ear: Tympanic membrane and ear canal normal.  Left Ear: Tympanic membrane and  ear canal normal.  Mouth/Throat: Oropharynx is clear and moist.  Eyes: Pupils are equal, round, and reactive to light. No scleral icterus.  Neck: Normal range of motion. No thyromegaly present.  Cardiovascular: Normal rate and regular rhythm.   No murmur heard. Pulmonary/Chest: Effort normal and breath sounds normal. No respiratory distress. He has no wheezes. She has no rales. She exhibits no tenderness.  Abdominal: Soft. Bowel sounds are normal. She exhibits no distension and no mass. There is no tenderness. There is no rebound and no guarding.  Musculoskeletal: She exhibits no edema.  Lymphadenopathy:    She has no cervical adenopathy.  Neurological: She is alert and oriented to person, place, and time. She has normal patellar reflexes. She exhibits normal muscle tone. Coordination normal.  Skin: Skin is warm and dry.  Psychiatric: She has a normal mood and affect. Her behavior is normal. Judgment and thought content normal.  Breasts: Examined lying Right: Without masses, retractions, discharge or axillary adenopathy.  Left: Without masses, retractions, discharge or axillary adenopathy.  Pelvic: deferred           Assessment & Plan:        Assessment & Plan:   Preventative care- discussed healthy diet, exercise, weight loss. EKG tracing is personally reviewed.  EKG notes NSR.  No acute changes.  Refer for mammogram, immunizations reviewed and up to date.  Obtain routine lab work.

## 2017-08-09 ENCOUNTER — Ambulatory Visit (HOSPITAL_BASED_OUTPATIENT_CLINIC_OR_DEPARTMENT_OTHER)
Admission: RE | Admit: 2017-08-09 | Discharge: 2017-08-09 | Disposition: A | Discharge status: Home / Self Care | Payer: 59 | Source: Ambulatory Visit | Attending: Family | Admitting: Family

## 2017-08-09 ENCOUNTER — Other Ambulatory Visit: Payer: Self-pay | Admitting: Family Medicine

## 2017-08-09 DIAGNOSIS — Z0000 Encounter for general adult medical examination without abnormal findings: Secondary | ICD-10-CM

## 2017-08-09 DIAGNOSIS — Z1231 Encounter for screening mammogram for malignant neoplasm of breast: Secondary | ICD-10-CM | POA: Insufficient documentation

## 2017-08-15 ENCOUNTER — Encounter: Payer: Self-pay | Admitting: Family

## 2017-08-26 DIAGNOSIS — L570 Actinic keratosis: Secondary | ICD-10-CM | POA: Diagnosis not present

## 2017-11-03 DIAGNOSIS — M25561 Pain in right knee: Secondary | ICD-10-CM | POA: Insufficient documentation

## 2017-11-03 DIAGNOSIS — S83411A Sprain of medial collateral ligament of right knee, initial encounter: Secondary | ICD-10-CM | POA: Diagnosis not present

## 2017-11-14 DIAGNOSIS — M25561 Pain in right knee: Secondary | ICD-10-CM | POA: Diagnosis not present

## 2017-11-14 DIAGNOSIS — S83411D Sprain of medial collateral ligament of right knee, subsequent encounter: Secondary | ICD-10-CM | POA: Diagnosis not present

## 2017-11-14 DIAGNOSIS — S83419A Sprain of medial collateral ligament of unspecified knee, initial encounter: Secondary | ICD-10-CM | POA: Insufficient documentation

## 2017-11-28 DIAGNOSIS — S83411D Sprain of medial collateral ligament of right knee, subsequent encounter: Secondary | ICD-10-CM | POA: Diagnosis not present

## 2017-12-31 DIAGNOSIS — H6983 Other specified disorders of Eustachian tube, bilateral: Secondary | ICD-10-CM | POA: Insufficient documentation

## 2017-12-31 DIAGNOSIS — M25561 Pain in right knee: Secondary | ICD-10-CM | POA: Diagnosis not present

## 2017-12-31 DIAGNOSIS — S83411D Sprain of medial collateral ligament of right knee, subsequent encounter: Secondary | ICD-10-CM | POA: Diagnosis not present

## 2017-12-31 DIAGNOSIS — H938X3 Other specified disorders of ear, bilateral: Secondary | ICD-10-CM | POA: Diagnosis not present

## 2018-03-05 DIAGNOSIS — Z0000 Encounter for general adult medical examination without abnormal findings: Secondary | ICD-10-CM | POA: Diagnosis not present

## 2018-05-07 DIAGNOSIS — Z23 Encounter for immunization: Secondary | ICD-10-CM | POA: Diagnosis not present

## 2018-06-25 DIAGNOSIS — J029 Acute pharyngitis, unspecified: Secondary | ICD-10-CM | POA: Diagnosis not present

## 2018-06-25 DIAGNOSIS — J018 Other acute sinusitis: Secondary | ICD-10-CM | POA: Diagnosis not present

## 2018-07-01 DIAGNOSIS — D485 Neoplasm of uncertain behavior of skin: Secondary | ICD-10-CM | POA: Diagnosis not present

## 2018-07-01 DIAGNOSIS — D229 Melanocytic nevi, unspecified: Secondary | ICD-10-CM | POA: Diagnosis not present

## 2018-07-01 DIAGNOSIS — Z85820 Personal history of malignant melanoma of skin: Secondary | ICD-10-CM | POA: Diagnosis not present

## 2018-07-03 ENCOUNTER — Other Ambulatory Visit: Payer: Self-pay | Admitting: Family

## 2018-08-10 ENCOUNTER — Encounter: Payer: Self-pay | Admitting: Family

## 2018-08-10 ENCOUNTER — Ambulatory Visit (INDEPENDENT_AMBULATORY_CARE_PROVIDER_SITE_OTHER): Payer: 59 | Admitting: Family

## 2018-08-10 VITALS — BP 115/72 | HR 78 | Temp 98.0°F | Resp 16 | Ht 71.0 in | Wt 218.0 lb

## 2018-08-10 DIAGNOSIS — Z23 Encounter for immunization: Secondary | ICD-10-CM

## 2018-08-10 DIAGNOSIS — Z0000 Encounter for general adult medical examination without abnormal findings: Secondary | ICD-10-CM

## 2018-08-10 DIAGNOSIS — K219 Gastro-esophageal reflux disease without esophagitis: Secondary | ICD-10-CM | POA: Diagnosis not present

## 2018-08-10 DIAGNOSIS — E559 Vitamin D deficiency, unspecified: Secondary | ICD-10-CM

## 2018-08-10 DIAGNOSIS — Z86006 Personal history of melanoma in-situ: Secondary | ICD-10-CM

## 2018-08-10 DIAGNOSIS — E785 Hyperlipidemia, unspecified: Secondary | ICD-10-CM

## 2018-08-10 DIAGNOSIS — Z1159 Encounter for screening for other viral diseases: Secondary | ICD-10-CM | POA: Diagnosis not present

## 2018-08-10 LAB — URINALYSIS, ROUTINE W REFLEX MICROSCOPIC
BILIRUBIN URINE: NEGATIVE
HGB URINE DIPSTICK: NEGATIVE
Ketones, ur: NEGATIVE
Nitrite: NEGATIVE
RBC / HPF: NONE SEEN (ref 0–?)
Specific Gravity, Urine: 1.025 (ref 1.000–1.030)
TOTAL PROTEIN, URINE-UPE24: NEGATIVE
URINE GLUCOSE: NEGATIVE
UROBILINOGEN UA: 0.2 (ref 0.0–1.0)
pH: 5 (ref 5.0–8.0)

## 2018-08-10 LAB — CBC WITH DIFFERENTIAL/PLATELET
BASOS PCT: 0.9 % (ref 0.0–3.0)
Basophils Absolute: 0.1 10*3/uL (ref 0.0–0.1)
EOS ABS: 0.2 10*3/uL (ref 0.0–0.7)
Eosinophils Relative: 2.7 % (ref 0.0–5.0)
HEMATOCRIT: 37.8 % (ref 36.0–46.0)
Hemoglobin: 12.6 g/dL (ref 12.0–15.0)
LYMPHS PCT: 34.9 % (ref 12.0–46.0)
Lymphs Abs: 2.1 10*3/uL (ref 0.7–4.0)
MCHC: 33.2 g/dL (ref 30.0–36.0)
MCV: 87.7 fl (ref 78.0–100.0)
Monocytes Absolute: 0.4 10*3/uL (ref 0.1–1.0)
Monocytes Relative: 6 % (ref 3.0–12.0)
NEUTROS ABS: 3.3 10*3/uL (ref 1.4–7.7)
Neutrophils Relative %: 55.5 % (ref 43.0–77.0)
PLATELETS: 239 10*3/uL (ref 150.0–400.0)
RBC: 4.32 Mil/uL (ref 3.87–5.11)
RDW: 13.6 % (ref 11.5–15.5)
WBC: 5.9 10*3/uL (ref 4.0–10.5)

## 2018-08-10 LAB — HEPATIC FUNCTION PANEL
ALBUMIN: 4.1 g/dL (ref 3.5–5.2)
ALT: 14 U/L (ref 0–35)
AST: 17 U/L (ref 0–37)
Alkaline Phosphatase: 119 U/L — ABNORMAL HIGH (ref 39–117)
Bilirubin, Direct: 0.1 mg/dL (ref 0.0–0.3)
TOTAL PROTEIN: 6.4 g/dL (ref 6.0–8.3)
Total Bilirubin: 0.6 mg/dL (ref 0.2–1.2)

## 2018-08-10 LAB — LIPID PANEL
CHOLESTEROL: 166 mg/dL (ref 0–200)
HDL: 44.1 mg/dL (ref >39.00)
LDL Cholesterol: 101 mg/dL — ABNORMAL HIGH (ref 0–99)
NonHDL: 121.45
TRIGLYCERIDES: 103 mg/dL (ref 0.0–149.0)
Total CHOL/HDL Ratio: 4
VLDL: 20.6 mg/dL (ref 0.0–40.0)

## 2018-08-10 LAB — BASIC METABOLIC PANEL
BUN: 14 mg/dL (ref 6–23)
CHLORIDE: 106 meq/L (ref 96–112)
CO2: 30 mEq/L (ref 19–32)
Calcium: 9.6 mg/dL (ref 8.4–10.5)
Creatinine, Ser: 0.92 mg/dL (ref 0.40–1.20)
GFR: 63.02 mL/min (ref >60.00)
Glucose, Bld: 101 mg/dL — ABNORMAL HIGH (ref 70–99)
POTASSIUM: 4.2 meq/L (ref 3.5–5.1)
Sodium: 143 mEq/L (ref 135–145)

## 2018-08-10 LAB — TSH: TSH: 3.77 u[IU]/mL (ref 0.35–4.50)

## 2018-08-10 NOTE — Progress Notes (Signed)
Subjective:    Patient ID: Karen Cobb, female    DOB: 07-07-1961, 58 y.o.   MRN: [id number]  HPI  Patient is a 58 yr old female who presents today for a complete physical.  Patient presents today for complete physical.  Immunizations: tdap 2017, flu 04/21/18 Diet: reports diet is OK Wt Readings from Last 3 Encounters:  08/10/18 218 lb (98.9 kg)  08/08/17 233 lb (105.7 kg)  06/06/17 232 lb 6.4 oz (105.4 kg)  Exercise:  Reports that she has not being going Colonoscopy:  12/18 will repeat in to 5 Dexa: 03/05/16 Pap Smear: 07/05/16 Mammogram: 08/09/17 Dental:  Up to date Vision: up to date  Hyperlipidemia- maintained on lipitor. Denies myalgia, reports good compliance. Lab Results  Component Value Date   CHOL 181 08/08/2017   HDL 47.60 08/08/2017   LDLCALC 113 (H) 08/08/2017   TRIG 106.0 08/08/2017   CHOLHDL 4 08/08/2017   GERD- continues omeprazole prn. Reports that her symptoms come and go.   Hx of melanoma in situ- continues to follow with dermatology  Vit D deficiency- only getting vit d in caltrate.   Review of Systems  Constitutional: Negative for unexpected weight change.  HENT: Positive for rhinorrhea. Negative for hearing loss.   Respiratory: Negative for cough.   Cardiovascular: Negative for leg swelling.  Gastrointestinal: Negative for diarrhea.       Mild constipation  Genitourinary: Negative for dysuria, frequency and hematuria.  Musculoskeletal: Negative for arthralgias and myalgias.  Skin: Negative for rash.  Neurological: Negative for headaches.  Hematological: Negative for adenopathy.  Psychiatric/Behavioral:       Denies depression/anxiety   Past Medical History:  Diagnosis Date  . Cancer ([place]) 1999   melanoma  . Hyperlipidemia      Social History   Socioeconomic History  . Marital status: Married    Spouse name: Not on file  . Number of children: Not on file  . Years of education: Not on file  . Highest education level: Not on file    Occupational History  . Not on file  Social Needs  . Financial resource strain: Not on file  . Food insecurity:    Worry: Not on file    Inability: Not on file  . Transportation needs:    Medical: Not on file    Non-medical: Not on file  Tobacco Use  . Smoking status: Former Research scientist (life sciences)  . Smokeless tobacco: Never Used  Substance and Sexual Activity  . Alcohol use: Yes    Alcohol/week: 0.0 standard drinks    Comment: rarely  . Drug use: No  . Sexual activity: Yes  Lifestyle  . Physical activity:    Days per week: Not on file    Minutes per session: Not on file  . Stress: Not on file  Relationships  . Social connections:    Talks on phone: Not on file    Gets together: Not on file    Attends religious service: Not on file    Active member of club or organization: Not on file    Attends meetings of clubs or organizations: Not on file    Relationship status: Not on file  . Intimate partner violence:    Fear of current or ex partner: Not on file    Emotionally abused: Not on file    Physically abused: Not on file    Forced sexual activity: Not on file  Other Topics Concern  . Not on file  Social History  Narrative   Works at [employer]   Married   One son- born 1998   One step son, one step daughter (they are grown)   Enjoys going to the [employer].     Completed HS    Past Surgical History:  Procedure Laterality Date  . ADENOIDECTOMY    . MELANOMA EXCISION Right    foot  . SHOULDER SURGERY Right 09/2016  . TOOTH EXTRACTION    . TUBAL LIGATION      Family History  Problem Relation Age of Onset  . Cancer Mother        colon CA and uterine cancer 2015  . Heart disease Father        CABG x 4   . Hypertension Father   . CVA Father        "mini strokes"  . AAA (abdominal aortic aneurysm) Father        s/p stent    No Known Allergies  Current Outpatient Medications on File Prior to Visit  Medication Sig Dispense  Refill  . atorvastatin (LIPITOR) 10 MG tablet Take 1 tablet (10 mg total) by mouth daily. 30 tablet 11  . B Complex-Biotin-FA (SUPER B-COMPLEX PO) Take 1 tablet by mouth daily.    . Calcium Carbonate-Vitamin D (CALTRATE 600+D PO) Take 1 tablet by mouth daily.    . calcium citrate-vitamin D (CITRACAL+D) 315-200 MG-UNIT tablet Take 1 tablet 1 day or 1 dose by mouth.    . Multiple Vitamin (MULTIVITAMIN) tablet Take 1 tablet by mouth daily.    Marland Kitchen omeprazole (PRILOSEC) 20 MG capsule TAKE 1 CAPSULE (20 MG TOTAL) DAILY BY MOUTH 30 capsule 5   No current facility-administered medications on file prior to visit.     BP 115/72 (BP Location: Right Arm, Patient Position: Sitting, Cuff Size: Large)   Pulse 78   Temp 98 F (36.7 C) (Oral)   Resp 16   Ht 5\' 11"  (1.803 m)   Wt 218 lb (98.9 kg)   SpO2 98%   BMI 30.40 kg/m       Objective:   Physical Exam  Physical Exam  Constitutional: She is oriented to person, place, and time. She appears well-developed and well-nourished. No distress.  HENT:  Head: Normocephalic and atraumatic.  Right Ear: Tympanic membrane and ear canal normal.  Left Ear: Tympanic membrane and ear canal normal.  Mouth/Throat: Oropharynx is clear and moist.  Eyes: Pupils are equal, round, and reactive to light. No scleral icterus.  Neck: Normal range of motion. No thyromegaly present.  Cardiovascular: Normal rate and regular rhythm.   No murmur heard. Pulmonary/Chest: Effort normal and breath sounds normal. No respiratory distress. He has no wheezes. She has no rales. She exhibits no tenderness.  Abdominal: Soft. Bowel sounds are normal. She exhibits no distension and no mass. There is no tenderness. There is no rebound and no guarding.  Musculoskeletal: She exhibits no edema.  Lymphadenopathy:    She has no cervical adenopathy.  Neurological: She is alert and oriented to person, place, and time. She has normal patellar reflexes. She exhibits normal muscle tone.  Coordination normal.  Skin: Skin is warm and dry.  Psychiatric: She has a normal mood and affect. Her behavior is normal. Judgment and thought content normal.  Breasts: Examined lying Right: Without masses, retractions, discharge or axillary adenopathy.  Left: Without masses, retractions, discharge or axillary adenopathy.        Assessment & Plan:  Preventative care- discussed healthy diet, exercise and weight loss.  Refer for mammogram. Obtain routine lab work including hep c screening. Shingrix #1. EKG tracing is personally reviewed.  EKG notes NSR.  No acute changes.    Vit D deficiency- obtain follow up vit D level.  Hyperlipidemia- obtain follow up lipid panel. Tolerating statin, continue same.  GERD- stable on prn omeprazole.   Hx of melanoma in situ- clinically stable. Following with Dermatology for surveillance.      Assessment & Plan:

## 2018-08-10 NOTE — Patient Instructions (Addendum)
Please complete lab work prior to leaving. Schedule mammogram on the first floor.  Continue to work on [employer], exercise and weight loss.

## 2018-08-13 ENCOUNTER — Encounter: Payer: Self-pay | Admitting: Family

## 2018-08-13 ENCOUNTER — Other Ambulatory Visit: Payer: Self-pay | Admitting: Family

## 2018-08-13 LAB — HEPATITIS C ANTIBODY
HEP C AB: NONREACTIVE
SIGNAL TO CUT-OFF: 0.01 (ref <1.00)

## 2018-08-13 LAB — VITAMIN D 1,25 DIHYDROXY
VITAMIN D 1, 25 (OH) TOTAL: 20 pg/mL (ref 18–72)
Vitamin D3 1, 25 (OH)2: 20 pg/mL

## 2018-08-13 MED ORDER — VITAMIN D3 75 MCG (3000 UT) PO TABS: 1.0000 | ORAL_TABLET | Freq: Every day | ORAL | Status: DC

## 2018-08-14 ENCOUNTER — Ambulatory Visit (HOSPITAL_BASED_OUTPATIENT_CLINIC_OR_DEPARTMENT_OTHER)
Admission: RE | Admit: 2018-08-14 | Discharge: 2018-08-14 | Disposition: A | Discharge status: Home / Self Care | Payer: 59 | Source: Ambulatory Visit | Attending: Family | Admitting: Family

## 2018-08-14 DIAGNOSIS — Z1231 Encounter for screening mammogram for malignant neoplasm of breast: Secondary | ICD-10-CM | POA: Diagnosis not present

## 2018-08-14 DIAGNOSIS — Z0000 Encounter for general adult medical examination without abnormal findings: Secondary | ICD-10-CM

## 2018-08-17 ENCOUNTER — Telehealth: Payer: Self-pay | Admitting: Family

## 2018-08-17 ENCOUNTER — Other Ambulatory Visit: Payer: Self-pay | Admitting: Family

## 2018-08-17 DIAGNOSIS — R928 Other abnormal and inconclusive findings on diagnostic imaging of breast: Secondary | ICD-10-CM

## 2018-08-17 NOTE — Telephone Encounter (Signed)
Copied from [place] [id number]. Topic: Quick Communication - See Telephone Encounter >> Aug 17, 2018  9:24 AM Bea Graff, NT wrote: CRM for notification. See Telephone encounter for: 08/17/18. Pt states that the imaging dept needs an order put in for them to do additional imaging of her breast. CB#: [id number]

## 2018-08-17 NOTE — Telephone Encounter (Signed)
Orders have been placed and tests have been scheduled.

## 2018-08-17 NOTE — Telephone Encounter (Signed)
See order(s).

## 2018-08-17 NOTE — Telephone Encounter (Signed)
Message routed to [facility] for orders.

## 2018-08-19 ENCOUNTER — Ambulatory Visit: Payer: 59

## 2018-08-19 ENCOUNTER — Ambulatory Visit
Admission: RE | Admit: 2018-08-19 | Discharge: 2018-08-19 | Disposition: A | Discharge status: Home / Self Care | Payer: 59 | Source: Ambulatory Visit | Attending: Family | Admitting: Family

## 2018-08-19 DIAGNOSIS — R928 Other abnormal and inconclusive findings on diagnostic imaging of breast: Secondary | ICD-10-CM

## 2018-10-09 ENCOUNTER — Ambulatory Visit: Payer: 59

## 2018-11-13 ENCOUNTER — Ambulatory Visit (INDEPENDENT_AMBULATORY_CARE_PROVIDER_SITE_OTHER): Payer: 59

## 2018-11-13 ENCOUNTER — Other Ambulatory Visit: Payer: Self-pay

## 2018-11-13 DIAGNOSIS — Z23 Encounter for immunization: Secondary | ICD-10-CM | POA: Diagnosis not present

## 2018-11-15 IMAGING — MG 2D DIGITAL SCREENING BILATERAL MAMMOGRAM WITH CAD AND ADJUNCT TO
9 of 12 series · 9 of 28 positions shown · non-contrast
Comparison: Previous exam(s).

CLINICAL DATA: Screening.

EXAM:
2D DIGITAL SCREENING BILATERAL MAMMOGRAM WITH CAD AND ADJUNCT TOMO

[L CC synth-2D]
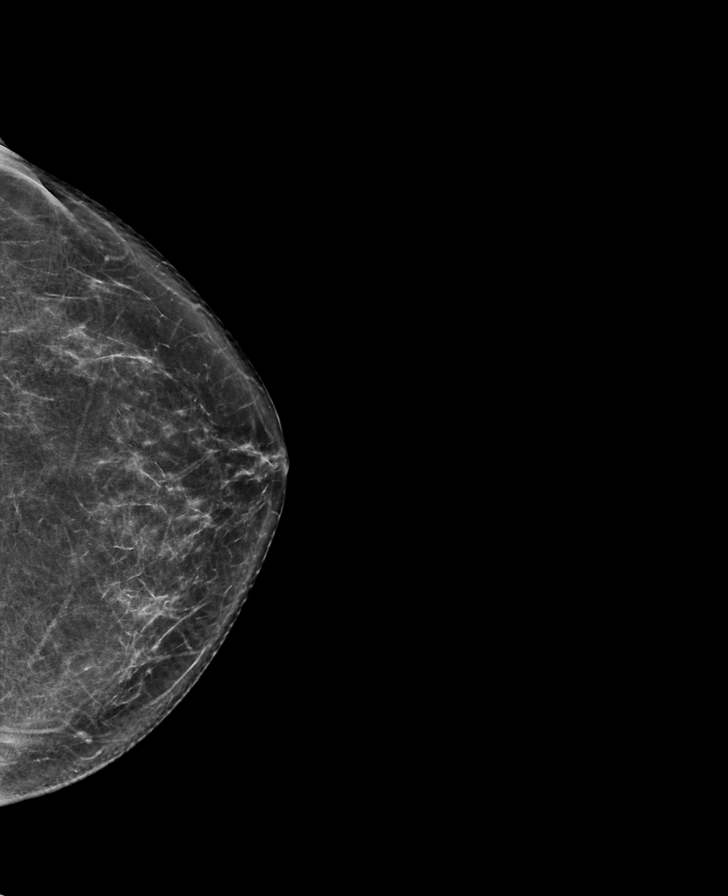

[L CC]
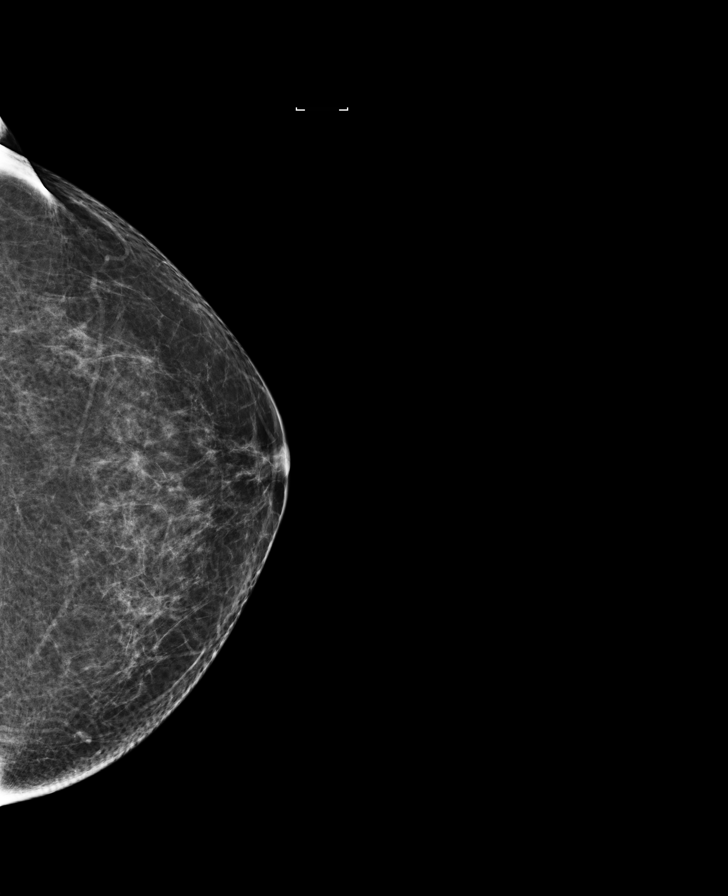

[R CC]
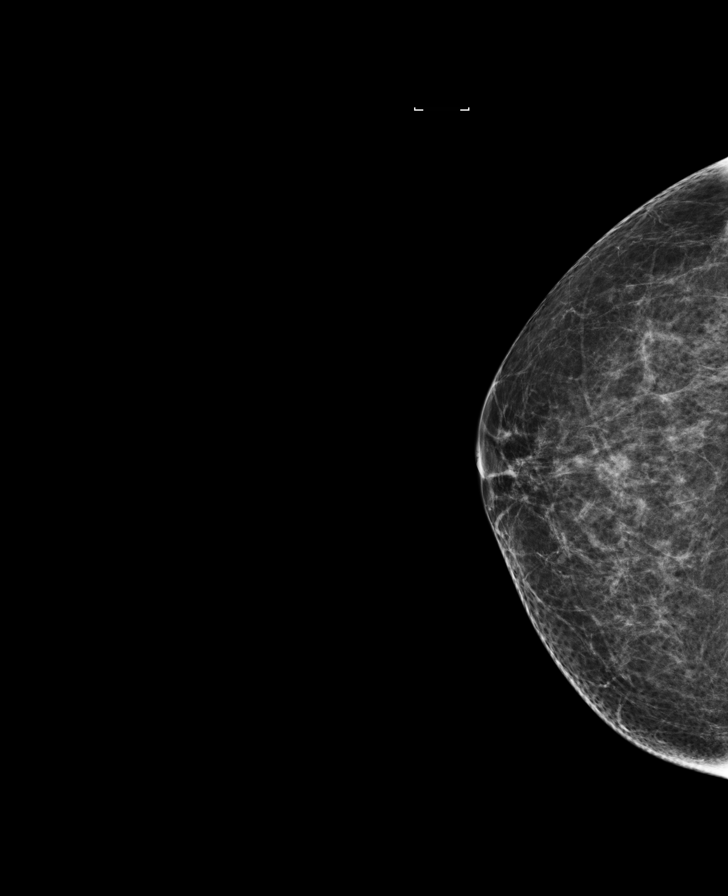

[L MLO]
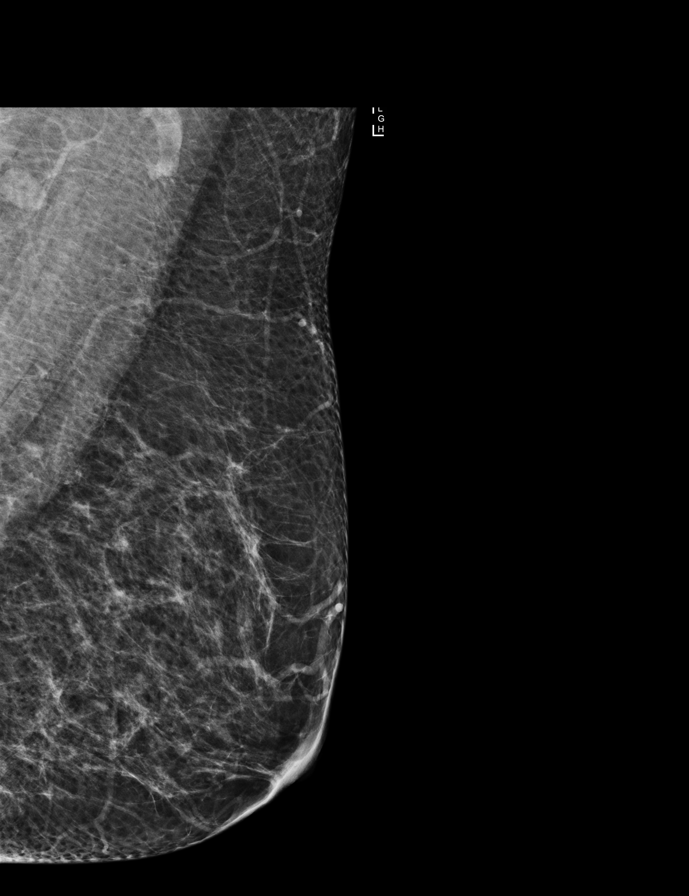

[R CC synth-2D]
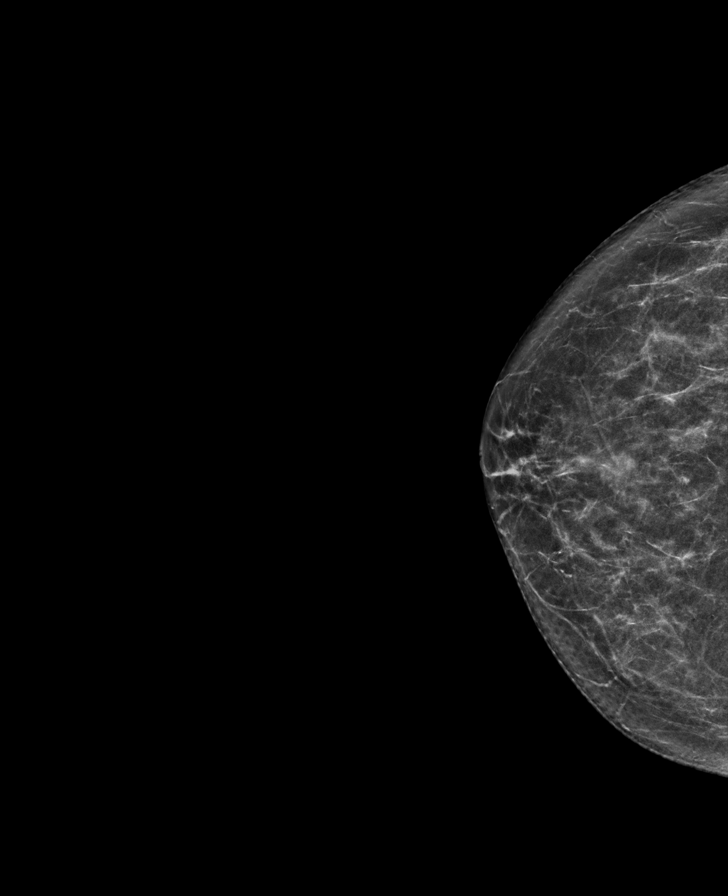

[R MLO synth-2D]
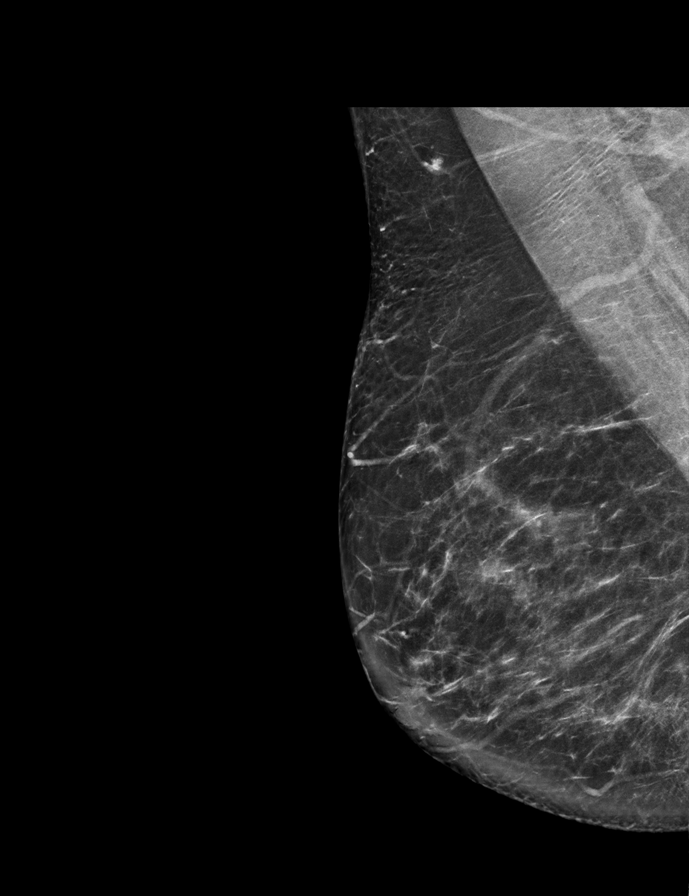

[L MLO synth-2D]
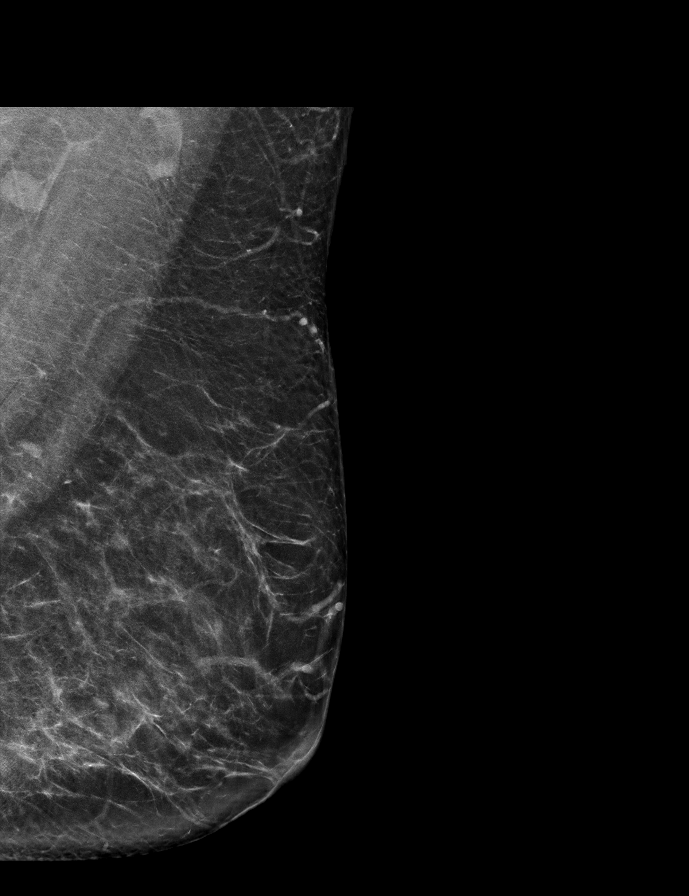

[R MLO]
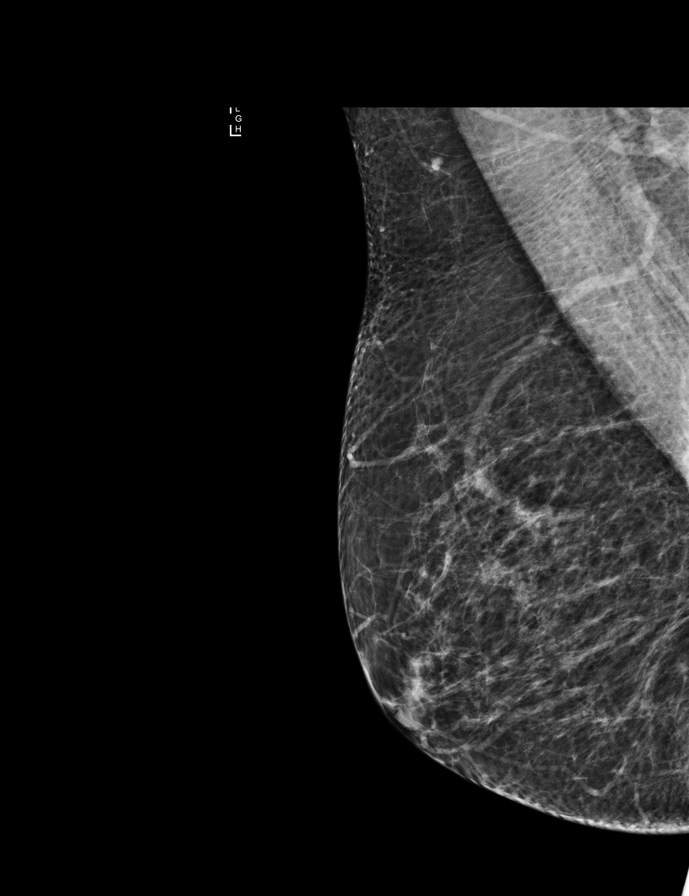

[R CC tomo · tomo slice 34/67.0]
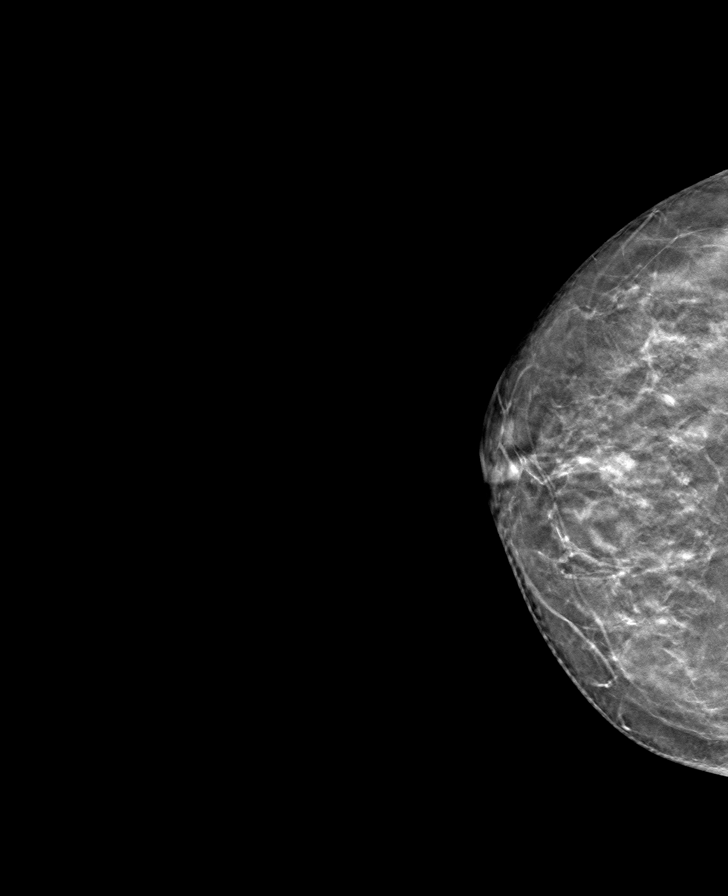

[9 of 28 positions shown; findings below may reference images not displayed]

ACR Breast Density Category b: There are scattered areas of
fibroglandular density.
FINDINGS: There are no findings suspicious for malignancy. Images were
processed with CAD.
IMPRESSION: No mammographic evidence of malignancy. A result letter of this
screening mammogram will be mailed directly to the patient.

RECOMMENDATION:
Screening mammogram in one year. (Code:97-6-RS4)

BI-RADS CATEGORY  1: Negative.

## 2019-01-05 ENCOUNTER — Other Ambulatory Visit: Payer: Self-pay | Admitting: Family

## 2019-05-10 ENCOUNTER — Encounter: Payer: Self-pay | Admitting: Family

## 2019-05-11 ENCOUNTER — Other Ambulatory Visit: Payer: Self-pay

## 2019-05-11 ENCOUNTER — Encounter: Payer: Self-pay | Admitting: Family

## 2019-05-11 ENCOUNTER — Ambulatory Visit (INDEPENDENT_AMBULATORY_CARE_PROVIDER_SITE_OTHER): Payer: 59 | Admitting: Family

## 2019-05-11 VITALS — BP 129/85 | HR 70 | Temp 97.4°F | Resp 16 | Ht 71.0 in | Wt 203.0 lb

## 2019-05-11 DIAGNOSIS — M5414 Radiculopathy, thoracic region: Secondary | ICD-10-CM | POA: Diagnosis not present

## 2019-05-11 NOTE — Patient Instructions (Signed)
Please call if you develop worsening back pain or shooting pain in the rib area.

## 2019-05-11 NOTE — Progress Notes (Signed)
Subjective:    Patient ID: Karen Cobb, female    DOB: 1960-08-17, 58 y.o.   MRN: [id number]  HPI   Patient is a 58 yr old female who presents today with chief complaint of pain beneath the left lower ribcage. Reports that she has had pain off and on for several months. Seems to happen "when I am doing something."  Happened more over the week. Also noted increased thoracic back pain over the weekend. Pain is sharp and brief and  "takes her breath away." Denies nausea/vomitting, fever.     Review of Systems See HPI  Past Medical History:  Diagnosis Date  . Cancer ([place]) 1999   melanoma  . Hyperlipidemia      Social History   Socioeconomic History  . Marital status: Married    Spouse name: Not on file  . Number of children: Not on file  . Years of education: Not on file  . Highest education level: Not on file  Occupational History  . Not on file  Social Needs  . Financial resource strain: Not on file  . Food insecurity    Worry: Not on file    Inability: Not on file  . Transportation needs    Medical: Not on file    Non-medical: Not on file  Tobacco Use  . Smoking status: Former Research scientist (life sciences)  . Smokeless tobacco: Never Used  Substance and Sexual Activity  . Alcohol use: Yes    Alcohol/week: 0.0 standard drinks    Comment: rarely  . Drug use: No  . Sexual activity: Yes  Lifestyle  . Physical activity    Days per week: Not on file    Minutes per session: Not on file  . Stress: Not on file  Relationships  . Social Herbalist on phone: Not on file    Gets together: Not on file    Attends religious service: Not on file    Active member of club or organization: Not on file    Attends meetings of clubs or organizations: Not on file    Relationship status: Not on file  . Intimate partner violence    Fear of current or ex partner: Not on file    Emotionally abused: Not on file    Physically abused: Not on file    Forced sexual activity: Not on file  Other  Topics Concern  . Not on file  Social History Narrative   Works at [employer]   Married   One son- born 1998   One step son, one step daughter (they are grown)   Enjoys going to the [employer].     Completed HS    Past Surgical History:  Procedure Laterality Date  . ADENOIDECTOMY    . MELANOMA EXCISION Right    foot  . SHOULDER SURGERY Right 09/2016  . TOOTH EXTRACTION    . TUBAL LIGATION      Family History  Problem Relation Age of Onset  . Cancer Mother        colon CA and uterine cancer 2015  . Heart disease Father        CABG x 4   . Hypertension Father   . CVA Father        "mini strokes"  . AAA (abdominal aortic aneurysm) Father        s/p stent    No Known Allergies  Current Outpatient Medications on File Prior  to Visit  Medication Sig Dispense Refill  . atorvastatin (LIPITOR) 10 MG tablet TAKE 1 TABLET BY MOUTH EVERY DAY 30 tablet 11  . B Complex-Biotin-FA (SUPER B-COMPLEX PO) Take 1 tablet by mouth daily.    . Calcium Carbonate-Vitamin D (CALTRATE 600+D PO) Take 1 tablet by mouth daily.    . calcium citrate-vitamin D (CITRACAL+D) 315-200 MG-UNIT tablet Take 1 tablet 1 day or 1 dose by mouth.    . Cholecalciferol (VITAMIN D3) 75 MCG (3000 UT) TABS Take 1 tablet by mouth daily. 30 tablet   . Multiple Vitamin (MULTIVITAMIN) tablet Take 1 tablet by mouth daily.    Marland Kitchen omeprazole (PRILOSEC) 20 MG capsule TAKE 1 CAPSULE (20 MG TOTAL) DAILY BY MOUTH 30 capsule 5   No current facility-administered medications on file prior to visit.     BP 129/85 (BP Location: Right Arm, Patient Position: Sitting, Cuff Size: Small)   Pulse 70   Temp (!) 97.4 F (36.3 C) (Temporal)   Resp 16   Ht 5\' 11"  (1.803 m)   Wt 203 lb (92.1 kg)   SpO2 100%   BMI 28.31 kg/m       Objective:   Physical Exam Constitutional:      Appearance: She is well-developed.  Neck:     Musculoskeletal: Neck supple.     Thyroid: No thyromegaly.   Cardiovascular:     Rate and Rhythm: Normal rate and regular rhythm.     Heart sounds: Normal heart sounds. No murmur.  Pulmonary:     Effort: Pulmonary effort is normal. No respiratory distress.     Breath sounds: Normal breath sounds. No wheezing.  Abdominal:     General: Bowel sounds are normal.     Palpations: Abdomen is soft.  Skin:    General: Skin is warm and dry.  Neurological:     Mental Status: She is alert and oriented to person, place, and time.  Psychiatric:        Behavior: Behavior normal.        Thought Content: Thought content normal.        Judgment: Judgment normal.           Assessment & Plan:  Thoracic radiculopathy- symptoms most consistent with thoracic radiculopathy. Will monitor for now. I have advised the pt to let me know if symptoms worsen or become more frequent.

## 2019-07-10 ENCOUNTER — Other Ambulatory Visit: Payer: Self-pay | Admitting: Family

## 2019-08-13 ENCOUNTER — Ambulatory Visit (INDEPENDENT_AMBULATORY_CARE_PROVIDER_SITE_OTHER): Payer: 59 | Admitting: Family

## 2019-08-13 ENCOUNTER — Encounter: Payer: Self-pay | Admitting: Family

## 2019-08-13 ENCOUNTER — Telehealth: Payer: Self-pay | Admitting: Family

## 2019-08-13 ENCOUNTER — Other Ambulatory Visit: Payer: Self-pay

## 2019-08-13 VITALS — BP 119/78 | HR 87 | Temp 96.7°F | Resp 16 | Ht 71.0 in | Wt 207.0 lb

## 2019-08-13 DIAGNOSIS — E785 Hyperlipidemia, unspecified: Secondary | ICD-10-CM

## 2019-08-13 DIAGNOSIS — E559 Vitamin D deficiency, unspecified: Secondary | ICD-10-CM | POA: Diagnosis not present

## 2019-08-13 DIAGNOSIS — Z0000 Encounter for general adult medical examination without abnormal findings: Secondary | ICD-10-CM

## 2019-08-13 DIAGNOSIS — Z780 Asymptomatic menopausal state: Secondary | ICD-10-CM

## 2019-08-13 LAB — HEPATIC FUNCTION PANEL
ALT: 16 U/L (ref 0–35)
AST: 16 U/L (ref 0–37)
Albumin: 4.2 g/dL (ref 3.5–5.2)
Alkaline Phosphatase: 121 U/L — ABNORMAL HIGH (ref 39–117)
Bilirubin, Direct: 0.1 mg/dL (ref 0.0–0.3)
Total Bilirubin: 0.5 mg/dL (ref 0.2–1.2)
Total Protein: 6.5 g/dL (ref 6.0–8.3)

## 2019-08-13 LAB — CBC WITH DIFFERENTIAL/PLATELET
Basophils Absolute: 0.1 10*3/uL (ref 0.0–0.1)
Basophils Relative: 1.1 % (ref 0.0–3.0)
Eosinophils Absolute: 0.2 10*3/uL (ref 0.0–0.7)
Eosinophils Relative: 3.4 % (ref 0.0–5.0)
HCT: 38.7 % (ref 36.0–46.0)
Hemoglobin: 12.8 g/dL (ref 12.0–15.0)
Lymphocytes Relative: 36.5 % (ref 12.0–46.0)
Lymphs Abs: 1.9 10*3/uL (ref 0.7–4.0)
MCHC: 33.2 g/dL (ref 30.0–36.0)
MCV: 88.4 fl (ref 78.0–100.0)
Monocytes Absolute: 0.3 10*3/uL (ref 0.1–1.0)
Monocytes Relative: 6.6 % (ref 3.0–12.0)
Neutro Abs: 2.7 10*3/uL (ref 1.4–7.7)
Neutrophils Relative %: 52.4 % (ref 43.0–77.0)
Platelets: 260 10*3/uL (ref 150.0–400.0)
RBC: 4.38 Mil/uL (ref 3.87–5.11)
RDW: 13.5 % (ref 11.5–15.5)
WBC: 5.2 10*3/uL (ref 4.0–10.5)

## 2019-08-13 LAB — LIPID PANEL
Cholesterol: 178 mg/dL (ref 0–200)
HDL: 51.2 mg/dL (ref >39.00)
LDL Cholesterol: 116 mg/dL — ABNORMAL HIGH (ref 0–99)
NonHDL: 126.67
Total CHOL/HDL Ratio: 3
Triglycerides: 55 mg/dL (ref 0.0–149.0)
VLDL: 11 mg/dL (ref 0.0–40.0)

## 2019-08-13 LAB — BASIC METABOLIC PANEL
BUN: 18 mg/dL (ref 6–23)
CO2: 28 mEq/L (ref 19–32)
Calcium: 9.3 mg/dL (ref 8.4–10.5)
Chloride: 106 mEq/L (ref 96–112)
Creatinine, Ser: 0.9 mg/dL (ref 0.40–1.20)
GFR: 64.41 mL/min (ref >60.00)
Glucose, Bld: 89 mg/dL (ref 70–99)
Potassium: 4.3 mEq/L (ref 3.5–5.1)
Sodium: 142 mEq/L (ref 135–145)

## 2019-08-13 LAB — TSH: TSH: 4.31 u[IU]/mL (ref 0.35–4.50)

## 2019-08-13 NOTE — Telephone Encounter (Signed)
Records released faxed

## 2019-08-13 NOTE — Patient Instructions (Signed)
Below are two ways to schedule a Covid-19 Vaccine:  Please visit [email or web address] to register or call [phone]  Or call:  [place] Covid-19 vaccine scheduling at [phone]  

## 2019-08-13 NOTE — Progress Notes (Signed)
Subjective:    Patient ID: Karen Cobb, female    DOB: 08/21/1960, 59 y.o.   MRN: [id number]  HPI   Patient is a 59 yr old female who presents for cpx.  Immunizations: tetanus flu and shingrix up to date Diet: healthy Exercise: not exercising Colonoscopy: 07/07/17 Dexa:  screening Pap Smear: 11/30, [facility] Mammogram: 05/2019 Dental: up to date Vision: up to date Sees dermatology annually due to melanoma hx.    Past Medical History:  Diagnosis Date  . Cancer ([place]) 1999   melanoma  . Hyperlipidemia      Social History   Socioeconomic History  . Marital status: Married    Spouse name: Not on file  . Number of children: Not on file  . Years of education: Not on file  . Highest education level: Not on file  Occupational History  . Not on file  Tobacco Use  . Smoking status: Former Research scientist (life sciences)  . Smokeless tobacco: Never Used  Substance and Sexual Activity  . Alcohol use: Yes    Alcohol/week: 0.0 standard drinks    Comment: rarely  . Drug use: No  . Sexual activity: Yes  Other Topics Concern  . Not on file  Social History Narrative   Works at [employer]   Married   One son- born 1998   One step son, one step daughter (they are grown)   Enjoys going to the [employer].     Completed HS   Social Determinants of Health   Financial Resource Strain:   . Difficulty of Paying Living Expenses: Not on file  Food Insecurity:   . Worried About Charity fundraiser in the Last Year: Not on file  . Ran Out of Food in the Last Year: Not on file  Transportation Needs:   . Lack of Transportation (Medical): Not on file  . Lack of Transportation (Non-Medical): Not on file  Physical Activity:   . Days of Exercise per Week: Not on file  . Minutes of Exercise per Session: Not on file  Stress:   . Feeling of Stress : Not on file  Social Connections:   . Frequency of Communication with Friends and Family: Not on file    . Frequency of Social Gatherings with Friends and Family: Not on file  . Attends Religious Services: Not on file  . Active Member of Clubs or Organizations: Not on file  . Attends Archivist Meetings: Not on file  . Marital Status: Not on file  Intimate Partner Violence:   . Fear of Current or Ex-Partner: Not on file  . Emotionally Abused: Not on file  . Physically Abused: Not on file  . Sexually Abused: Not on file    Past Surgical History:  Procedure Laterality Date  . ADENOIDECTOMY    . MELANOMA EXCISION Right    foot  . SHOULDER SURGERY Right 09/2016  . TOOTH EXTRACTION    . TUBAL LIGATION      Family History  Problem Relation Age of Onset  . Cancer Mother        colon CA and uterine cancer 2015  . Heart disease Father        CABG x 4   . Hypertension Father   . CVA Father        "mini strokes"  . AAA (abdominal aortic aneurysm) Father        s/p stent    No Known  Allergies  Current Outpatient Medications on File Prior to Visit  Medication Sig Dispense Refill  . atorvastatin (LIPITOR) 10 MG tablet TAKE 1 TABLET BY MOUTH EVERY DAY 30 tablet 11  . Calcium Carbonate-Vitamin D (CALTRATE 600+D PO) Take 1 tablet by mouth daily.    . calcium citrate-vitamin D (CITRACAL+D) 315-200 MG-UNIT tablet Take 1 tablet 1 day or 1 dose by mouth.    . Cholecalciferol (VITAMIN D3) 75 MCG (3000 UT) TABS Take 1 tablet by mouth daily. 30 tablet   . Multiple Vitamin (MULTIVITAMIN) tablet Take 1 tablet by mouth daily.    Marland Kitchen omeprazole (PRILOSEC) 20 MG capsule TAKE 1 CAPSULE (20 MG TOTAL) DAILY BY MOUTH 30 capsule 5  . B Complex-Biotin-FA (SUPER B-COMPLEX PO) Take 1 tablet by mouth daily.     No current facility-administered medications on file prior to visit.    BP 119/78 (BP Location: Right Arm, Patient Position: Sitting, Cuff Size: Small)   Pulse 87   Temp (!) 96.7 F (35.9 C) (Temporal)   Resp 16   Ht 5\' 11"  (1.803 m)   Wt 207 lb (93.9 kg)   SpO2 100%   BMI 28.87  kg/m       Review of Systems  Constitutional: Negative for unexpected weight change.  HENT: Negative for hearing loss and rhinorrhea.   Eyes: Negative for visual disturbance.  Respiratory: Negative for cough and shortness of breath.   Cardiovascular: Negative for chest pain and leg swelling.  Gastrointestinal: Negative for blood in stool, constipation and diarrhea.  Genitourinary: Negative for dysuria, frequency and hematuria.  Musculoskeletal: Negative for arthralgias and myalgias.  Skin: Negative for color change and rash.  Neurological: Negative for headaches.  Hematological: Negative for adenopathy.  Psychiatric/Behavioral:       Denies depression/anxiety   Past Medical History:  Diagnosis Date  . Cancer ([place]) 1999   melanoma  . Hyperlipidemia      Social History   Socioeconomic History  . Marital status: Married    Spouse name: Not on file  . Number of children: Not on file  . Years of education: Not on file  . Highest education level: Not on file  Occupational History  . Not on file  Tobacco Use  . Smoking status: Former Research scientist (life sciences)  . Smokeless tobacco: Never Used  Substance and Sexual Activity  . Alcohol use: Yes    Alcohol/week: 0.0 standard drinks    Comment: rarely  . Drug use: No  . Sexual activity: Yes  Other Topics Concern  . Not on file  Social History Narrative   Works at [employer]   Married   One son- born 1998   One step son, one step daughter (they are grown)   Enjoys going to the [employer].     Completed HS   Social Determinants of Health   Financial Resource Strain:   . Difficulty of Paying Living Expenses: Not on file  Food Insecurity:   . Worried About Charity fundraiser in the Last Year: Not on file  . Ran Out of Food in the Last Year: Not on file  Transportation Needs:   . Lack of Transportation (Medical): Not on file  . Lack of Transportation (Non-Medical): Not on file  Physical  Activity:   . Days of Exercise per Week: Not on file  . Minutes of Exercise per Session: Not on file  Stress:   . Feeling of Stress : Not on file  Social Connections:   . Frequency of Communication with Friends and Family: Not on file  . Frequency of Social Gatherings with Friends and Family: Not on file  . Attends Religious Services: Not on file  . Active Member of Clubs or Organizations: Not on file  . Attends Archivist Meetings: Not on file  . Marital Status: Not on file  Intimate Partner Violence:   . Fear of Current or Ex-Partner: Not on file  . Emotionally Abused: Not on file  . Physically Abused: Not on file  . Sexually Abused: Not on file    Past Surgical History:  Procedure Laterality Date  . ADENOIDECTOMY    . MELANOMA EXCISION Right    foot  . SHOULDER SURGERY Right 09/2016  . TOOTH EXTRACTION    . TUBAL LIGATION      Family History  Problem Relation Age of Onset  . Cancer Mother        colon CA and uterine cancer 2015  . Heart disease Father        CABG x 4   . Hypertension Father   . CVA Father        "mini strokes"  . AAA (abdominal aortic aneurysm) Father        s/p stent  . Developmental delay Sister     No Known Allergies  Current Outpatient Medications on File Prior to Visit  Medication Sig Dispense Refill  . atorvastatin (LIPITOR) 10 MG tablet TAKE 1 TABLET BY MOUTH EVERY DAY 30 tablet 11  . Calcium Carbonate-Vitamin D (CALTRATE 600+D PO) Take 1 tablet by mouth daily.    . calcium citrate-vitamin D (CITRACAL+D) 315-200 MG-UNIT tablet Take 1 tablet 1 day or 1 dose by mouth.    . Cholecalciferol (VITAMIN D3) 75 MCG (3000 UT) TABS Take 1 tablet by mouth daily. 30 tablet   . Multiple Vitamin (MULTIVITAMIN) tablet Take 1 tablet by mouth daily.    Marland Kitchen omeprazole (PRILOSEC) 20 MG capsule TAKE 1 CAPSULE (20 MG TOTAL) DAILY BY MOUTH 30 capsule 5  . B Complex-Biotin-FA (SUPER B-COMPLEX PO) Take 1 tablet by mouth daily.     No current  facility-administered medications on file prior to visit.    BP 119/78 (BP Location: Right Arm, Patient Position: Sitting, Cuff Size: Small)   Pulse 87   Temp (!) 96.7 F (35.9 C) (Temporal)   Resp 16   Ht 5\' 11"  (1.803 m)   Wt 207 lb (93.9 kg)   SpO2 100%   BMI 28.87 kg/m       Objective:   Physical Exam  Physical Exam  Constitutional: She is oriented to person, place, and time. She appears well-developed and well-nourished. No distress.  HENT:  Head: Normocephalic and atraumatic.  Right Ear: Tympanic membrane and ear canal normal.  Left Ear: Tympanic membrane and ear canal normal.  Mouth/Throat: Not examined- pt wearing mask Eyes: Pupils are equal, round, and reactive to light. No scleral icterus.  Neck: Normal range of motion. No thyromegaly present.  Cardiovascular: Normal rate and regular rhythm.   No murmur heard. Pulmonary/Chest: Effort normal and breath sounds normal. No respiratory distress. He has no wheezes. She has no rales. She exhibits no tenderness.  Abdominal: Soft. Bowel sounds are normal. She exhibits no distension and no mass. There is no tenderness. There is no rebound and no guarding.  Musculoskeletal: She exhibits no edema.  Lymphadenopathy:    She has no cervical adenopathy.  Neurological: She is alert and oriented  to person, place, and time. She has normal patellar reflexes. She exhibits normal muscle tone. Coordination normal.  Skin: Skin is warm and dry.  Psychiatric: She has a normal mood and affect. Her behavior is normal. Judgment and thought content normal.  Breasts: Examined lying Right: Without masses, retractions, discharge or axillary adenopathy.  Left: Without masses, retractions, discharge or axillary adenopathy.  Pelvic: Deferred          Assessment & Plan:   Preventative care- recommended covid vaccine when she is able. Other vaccines reviewed and up to date.  Mammo/pap up to date. Discussed healthy diet and exercise. Will obtain  follow up dexa.  Hyperlipidemia- tolerating statin- obtain lipid panel.  Vit D deficiency- on OTC supplement. Check follow up vit D level.  This visit occurred during the SARS-CoV-2 public health emergency.  Safety protocols were in place, including screening questions prior to the visit, additional usage of staff PPE, and extensive cleaning of exam room while observing appropriate contact time as indicated for disinfecting solutions.       Assessment & Plan:

## 2019-08-13 NOTE — Telephone Encounter (Signed)
Please request pap from [facility].

## 2019-08-16 LAB — VITAMIN D 1,25 DIHYDROXY
Vitamin D 1, 25 (OH)2 Total: 36 pg/mL (ref 18–72)
Vitamin D2 1, 25 (OH)2: <8 pg/mL
Vitamin D3 1, 25 (OH)2: 36 pg/mL

## 2019-08-18 ENCOUNTER — Other Ambulatory Visit (HOSPITAL_BASED_OUTPATIENT_CLINIC_OR_DEPARTMENT_OTHER): Payer: Self-pay | Admitting: Family

## 2019-08-18 DIAGNOSIS — Z1231 Encounter for screening mammogram for malignant neoplasm of breast: Secondary | ICD-10-CM

## 2019-08-27 ENCOUNTER — Other Ambulatory Visit: Payer: Self-pay

## 2019-08-27 ENCOUNTER — Ambulatory Visit (HOSPITAL_BASED_OUTPATIENT_CLINIC_OR_DEPARTMENT_OTHER)
Admission: RE | Admit: 2019-08-27 | Discharge: 2019-08-27 | Disposition: A | Discharge status: Home / Self Care | Payer: 59 | Source: Ambulatory Visit | Attending: Family | Admitting: Family

## 2019-08-27 DIAGNOSIS — Z0000 Encounter for general adult medical examination without abnormal findings: Secondary | ICD-10-CM | POA: Diagnosis not present

## 2019-08-27 DIAGNOSIS — Z780 Asymptomatic menopausal state: Secondary | ICD-10-CM

## 2019-08-27 DIAGNOSIS — Z1231 Encounter for screening mammogram for malignant neoplasm of breast: Secondary | ICD-10-CM | POA: Insufficient documentation

## 2019-09-03 ENCOUNTER — Other Ambulatory Visit: Payer: Self-pay | Admitting: Family

## 2019-09-15 ENCOUNTER — Encounter: Payer: Self-pay | Admitting: *Deleted

## 2019-10-04 ENCOUNTER — Other Ambulatory Visit: Payer: Self-pay

## 2019-10-04 ENCOUNTER — Encounter: Payer: Self-pay | Admitting: Dermatology

## 2019-10-04 ENCOUNTER — Ambulatory Visit (INDEPENDENT_AMBULATORY_CARE_PROVIDER_SITE_OTHER): Payer: 59 | Admitting: Dermatology

## 2019-10-04 DIAGNOSIS — L570 Actinic keratosis: Secondary | ICD-10-CM

## 2019-10-04 NOTE — Progress Notes (Signed)
Per pt she didn't have a great response like she did with the blue light last year.

## 2019-10-04 NOTE — Progress Notes (Signed)
   Follow-Up Visit   Subjective  Karen Cobb is a 59 y.o. female who presents for the following: Follow-up (PDT follow up).  The following portions of the chart were reviewed this encounter and updated as appropriate: Tobacco  Meds  Problems  Med Hx  Surg Hx  Fam Hx      AK Location: face Duration: months Quality: never cleared Associated Signs/Symptoms: none Modifying Factors: PDT 12/20 Severity:  Timing: Context:   Review of Systems: Pertinent items are noted in HPI.  Objective  Well appearing patient in no apparent distress; mood and affect are within normal limits.  A focused examination was performed including head, including the scalp, face, neck, nose, ears, eyelids, and lips. Relevant physical exam findings are noted in the Assessment and Plan.  Objective  Left Upper Vermilion Lip: Erythematous patches with gritty scale 55mm, non-infiltrated.  Assessment & Plan  AK (actinic keratosis) Left Upper Vermilion Lip  Destruction of lesion - Left Upper Vermilion Lip  Destruction method: cryotherapy   Lesion destroyed using liquid nitrogen: Yes    Patient informed area will swell/peel. Risk: failure, scar.

## 2019-12-21 ENCOUNTER — Encounter: Payer: Self-pay | Admitting: Family

## 2020-02-13 ENCOUNTER — Other Ambulatory Visit: Payer: Self-pay | Admitting: Family

## 2020-03-22 ENCOUNTER — Other Ambulatory Visit: Payer: Self-pay | Admitting: Family

## 2020-08-14 ENCOUNTER — Other Ambulatory Visit: Payer: Self-pay | Admitting: Family

## 2020-08-15 ENCOUNTER — Encounter: Payer: 59 | Admitting: Family

## 2020-08-30 ENCOUNTER — Telehealth: Payer: Self-pay | Admitting: Family

## 2020-08-30 ENCOUNTER — Ambulatory Visit (INDEPENDENT_AMBULATORY_CARE_PROVIDER_SITE_OTHER): Payer: 59 | Admitting: Family

## 2020-08-30 ENCOUNTER — Other Ambulatory Visit: Payer: Self-pay

## 2020-08-30 ENCOUNTER — Encounter: Payer: Self-pay | Admitting: Family

## 2020-08-30 VITALS — BP 117/70 | HR 85 | Temp 98.3°F | Resp 16 | Ht 71.0 in | Wt 212.6 lb

## 2020-08-30 DIAGNOSIS — E559 Vitamin D deficiency, unspecified: Secondary | ICD-10-CM | POA: Diagnosis not present

## 2020-08-30 DIAGNOSIS — E785 Hyperlipidemia, unspecified: Secondary | ICD-10-CM | POA: Diagnosis not present

## 2020-08-30 DIAGNOSIS — Z0000 Encounter for general adult medical examination without abnormal findings: Secondary | ICD-10-CM

## 2020-08-30 DIAGNOSIS — K219 Gastro-esophageal reflux disease without esophagitis: Secondary | ICD-10-CM | POA: Diagnosis not present

## 2020-08-30 LAB — COMPREHENSIVE METABOLIC PANEL
ALT: 15 U/L (ref 0–35)
AST: 15 U/L (ref 0–37)
Albumin: 4.2 g/dL (ref 3.5–5.2)
Alkaline Phosphatase: 120 U/L — ABNORMAL HIGH (ref 39–117)
BUN: 18 mg/dL (ref 6–23)
CO2: 30 mEq/L (ref 19–32)
Calcium: 9.4 mg/dL (ref 8.4–10.5)
Chloride: 105 mEq/L (ref 96–112)
Creatinine, Ser: 0.86 mg/dL (ref 0.40–1.20)
GFR: 74.37 mL/min (ref >60.00)
Glucose, Bld: 91 mg/dL (ref 70–99)
Potassium: 5 mEq/L (ref 3.5–5.1)
Sodium: 142 mEq/L (ref 135–145)
Total Bilirubin: 0.5 mg/dL (ref 0.2–1.2)
Total Protein: 6.5 g/dL (ref 6.0–8.3)

## 2020-08-30 LAB — LIPID PANEL
Cholesterol: 180 mg/dL (ref 0–200)
HDL: 55.2 mg/dL (ref >39.00)
LDL Cholesterol: 112 mg/dL — ABNORMAL HIGH (ref 0–99)
NonHDL: 125.1
Total CHOL/HDL Ratio: 3
Triglycerides: 64 mg/dL (ref 0.0–149.0)
VLDL: 12.8 mg/dL (ref 0.0–40.0)

## 2020-08-30 LAB — VITAMIN D 25 HYDROXY (VIT D DEFICIENCY, FRACTURES): VITD: 23.13 ng/mL — ABNORMAL LOW (ref 30.00–100.00)

## 2020-08-30 MED ORDER — VITAMIN D (ERGOCALCIFEROL) 1.25 MG (50000 UNIT) PO CAPS
50000.0000 [IU] | ORAL_CAPSULE | ORAL | 0 refills | Status: DC
Start: 2020-08-30 — End: 2020-11-22

## 2020-08-30 MED ORDER — ATORVASTATIN CALCIUM 10 MG PO TABS: 10.0000 mg | ORAL_TABLET | Freq: Every day | ORAL | 3 refills | Status: DC

## 2020-08-30 NOTE — Patient Instructions (Signed)
Please complete lab work prior to leaving.   

## 2020-08-30 NOTE — Telephone Encounter (Signed)
Vitamin D is low. I would like her to add 50000 iu vit D once weekly and repeat vitamin D level in 12 weeks. She can stop the otc dose while she is on the weekly dose.   Other lab work is stable.

## 2020-08-30 NOTE — Addendum Note (Signed)
Addended byDamita Dunnings D on: 08/30/2020 01:14 PM   Modules accepted: Orders

## 2020-08-30 NOTE — Telephone Encounter (Signed)
Spoke w/ Pt- informed of results and recommendations. Lab appt scheduled 11/22/20 at 10:30am. Vit D ordered.

## 2020-08-30 NOTE — Progress Notes (Signed)
Subjective:    Patient ID: Karen Cobb, female    DOB: January 19, 1961, 60 y.o.   MRN: [id number]  HPI  Patient is a 60 yr old female who presents today for cpx.  Immunizations: Shingrix x 2, tdap 2017 Diet:  fair diet Wt Readings from Last 3 Encounters:  08/30/20 212 lb 9.6 oz (96.4 kg)  08/13/19 207 lb (93.9 kg)  05/11/19 203 lb (92.1 kg)  Exercise:  Not exercising, has a gym membership but doesn't go Colonoscopy: 07/07/17  Dexa: 2021 Pap Smear:  07/19/2019 Mammogram: 08/27/19 Vision:  Up to date Dental:  Up to date  GERD- maintained on omeprazole 20mg  once daily as needed. Reports that sometimes she uses zantac 360 as needed.    Hyperlipidemia- maintained on atorvastatin 10 mg. Lab Results  Component Value Date   CHOL 178 08/13/2019   HDL 51.20 08/13/2019   LDLCALC 116 (H) 08/13/2019   TRIG 55.0 08/13/2019   CHOLHDL 3 08/13/2019   Vit D deficiency- She is maintained on 3000iu once daily.    Hx of melanoma insitu- reports that she sees dermatology annually for skin checks.   Review of Systems  Constitutional: Negative for unexpected weight change.  HENT: Negative for hearing loss and rhinorrhea.   Eyes: Negative for visual disturbance.  Respiratory: Negative for cough and shortness of breath.   Cardiovascular: Negative for chest pain.  Gastrointestinal: Negative for diarrhea, nausea and vomiting.  Genitourinary: Negative for dysuria, frequency and hematuria.  Musculoskeletal: Negative for arthralgias and myalgias.  Skin: Negative for rash.  Neurological: Positive for headaches (rarely).  Hematological: Negative for adenopathy.  Psychiatric/Behavioral:       Denies depression/anxiety    Past Medical History:  Diagnosis Date  . Cancer ([place]) 1999   melanoma  . Hyperlipidemia   . Melanoma ([place]) 09/27/1999   LEVEL II RIGHT FOOT - [place]     Social History   Socioeconomic History  . Marital status: Married    Spouse name: Not on file  . Number of  children: Not on file  . Years of education: Not on file  . Highest education level: Not on file  Occupational History  . Not on file  Tobacco Use  . Smoking status: Former Research scientist (life sciences)  . Smokeless tobacco: Never Used  Vaping Use  . Vaping Use: Never used  Substance and Sexual Activity  . Alcohol use: Not Currently    Alcohol/week: 0.0 standard drinks    Comment: rarely  . Drug use: No  . Sexual activity: Yes  Other Topics Concern  . Not on file  Social History Narrative   Works at [employer]   Married   One son- born 1998   One step son, one step daughter (they are grown)    Enjoys going to the [employer].     Completed HS    Social Determinants of Health   Financial Resource Strain: Not on file  Food Insecurity: Not on file  Transportation Needs: Not on file  Physical Activity: Not on file  Stress: Not on file  Social Connections: Not on file  Intimate Partner Violence: Not on file    Past Surgical History:  Procedure Laterality Date  . ADENOIDECTOMY    . eyelid lift surgery    . MELANOMA EXCISION Right    foot  . SHOULDER SURGERY Right 09/2016  . TOOTH EXTRACTION    . TUBAL LIGATION      Family  History  Problem Relation Age of Onset  . Cancer Mother        colon CA and uterine cancer 2015  . Heart disease Father        CABG x 4   . Hypertension Father   . CVA Father        "mini strokes"  . AAA (abdominal aortic aneurysm) Father        s/p stent  . Developmental delay Sister     No Known Allergies  Current Outpatient Medications on File Prior to Visit  Medication Sig Dispense Refill  . atorvastatin (LIPITOR) 10 MG tablet TAKE 1 TABLET BY MOUTH EVERY DAY 30 tablet 5  . B Complex-Biotin-FA (SUPER B-COMPLEX PO) Take 1 tablet by mouth daily.    . Calcium Carbonate-Vitamin D (CALTRATE 600+D PO) Take 1 tablet by mouth daily.    . calcium citrate-vitamin D (CITRACAL+D) 315-200 MG-UNIT tablet Take 1 tablet 1 day  or 1 dose by mouth.    . Cholecalciferol (VITAMIN D3) 75 MCG (3000 UT) TABS Take 1 tablet by mouth daily. 30 tablet   . Multiple Vitamin (MULTIVITAMIN) tablet Take 1 tablet by mouth daily.    Marland Kitchen omeprazole (PRILOSEC) 20 MG capsule TAKE 1 CAPSULE (20 MG TOTAL) DAILY BY MOUTH 30 capsule 5   No current facility-administered medications on file prior to visit.    BP 117/70   Pulse 85   Temp 98.3 F (36.8 C)   Resp 16   Ht 5\' 11"  (1.803 m)   Wt 212 lb 9.6 oz (96.4 kg)   SpO2 100%   BMI 29.65 kg/m       Objective:   Physical Exam  Physical Exam  Constitutional: She is oriented to person, place, and time. She appears well-developed and well-nourished. No distress.  HENT:  Head: Normocephalic and atraumatic.  Right Ear: Tympanic membrane and ear canal normal.  Left Ear: Tympanic membrane and ear canal normal.  Mouth/Throat: Not examined- pt wearing mask Eyes: Pupils are equal, round, and reactive to light. No scleral icterus.  Neck: Normal range of motion. No thyromegaly present.  Cardiovascular: Normal rate and regular rhythm.   No murmur heard. Pulmonary/Chest: Effort normal and breath sounds normal. No respiratory distress. He has no wheezes. She has no rales. She exhibits no tenderness.  Abdominal: Soft. Bowel sounds are normal. She exhibits no distension and no mass. There is no tenderness. There is no rebound and no guarding.  Musculoskeletal: She exhibits no edema.  Lymphadenopathy:    She has no cervical adenopathy.  Neurological: She is alert and oriented to person, place, and time. She has normal patellar reflexes. She exhibits normal muscle tone. Coordination normal.  Skin: Skin is warm and dry.  Psychiatric: She has a normal mood and affect. Her behavior is normal. Judgment and thought content normal.  Breast/pelvic: deferred      Assessment & Plan:   Preventative care- discussed healthy diet, exercise and weight loss. Immunizations reviewed and up to date.  Colo/dexa up to date. Mammogram is due.  Order placed.  Hyperlipidemia- tolerating atorvastatin 10mg . Continue same.  Obtain follow up lipid panel.  GERD- fair control. Continue omeprazole 20mg  po daily prn.  Vit D deficiency- obtain follow up vit D level.  Continue vit D 3000 iu once daily.  This visit occurred during the SARS-CoV-2 public health emergency.  Safety protocols were in place, including screening questions prior to the visit, additional usage of staff PPE, and extensive cleaning of exam room  while observing appropriate contact time as indicated for disinfecting solutions.          Assessment & Plan:

## 2020-09-04 ENCOUNTER — Inpatient Hospital Stay (HOSPITAL_BASED_OUTPATIENT_CLINIC_OR_DEPARTMENT_OTHER): Admission: RE | Admit: 2020-09-04 | Payer: 59 | Source: Ambulatory Visit

## 2020-09-05 ENCOUNTER — Ambulatory Visit (HOSPITAL_BASED_OUTPATIENT_CLINIC_OR_DEPARTMENT_OTHER)
Admission: RE | Admit: 2020-09-05 | Discharge: 2020-09-05 | Disposition: A | Discharge status: Home / Self Care | Payer: 59 | Source: Ambulatory Visit | Attending: Family | Admitting: Family

## 2020-09-05 ENCOUNTER — Other Ambulatory Visit: Payer: Self-pay

## 2020-09-05 DIAGNOSIS — Z0000 Encounter for general adult medical examination without abnormal findings: Secondary | ICD-10-CM

## 2020-09-05 DIAGNOSIS — Z1231 Encounter for screening mammogram for malignant neoplasm of breast: Secondary | ICD-10-CM | POA: Insufficient documentation

## 2020-11-22 ENCOUNTER — Other Ambulatory Visit: Payer: Self-pay

## 2020-11-22 ENCOUNTER — Telehealth: Payer: Self-pay | Admitting: Family

## 2020-11-22 ENCOUNTER — Other Ambulatory Visit (INDEPENDENT_AMBULATORY_CARE_PROVIDER_SITE_OTHER): Payer: 59

## 2020-11-22 DIAGNOSIS — E559 Vitamin D deficiency, unspecified: Secondary | ICD-10-CM | POA: Diagnosis not present

## 2020-11-22 LAB — VITAMIN D 25 HYDROXY (VIT D DEFICIENCY, FRACTURES): VITD: 34.85 ng/mL (ref 30.00–100.00)

## 2020-11-22 MED ORDER — VITAMIN D3 75 MCG (3000 UT) PO TABS: 1.0000 | ORAL_TABLET | Freq: Every day | ORAL | Status: DC

## 2020-11-22 NOTE — Telephone Encounter (Signed)
Vitamin d is now normal. She should stop the weekly vit D 50000 iu and instead begin otc vit D 3000 iu once daily.

## 2020-11-24 ENCOUNTER — Other Ambulatory Visit: Payer: Self-pay | Admitting: Family

## 2020-11-24 ENCOUNTER — Telehealth: Payer: Self-pay | Admitting: Family

## 2020-11-24 NOTE — Telephone Encounter (Signed)
See mychart.  

## 2021-01-05 ENCOUNTER — Ambulatory Visit (INDEPENDENT_AMBULATORY_CARE_PROVIDER_SITE_OTHER): Payer: 59 | Admitting: Family

## 2021-01-05 ENCOUNTER — Other Ambulatory Visit: Payer: Self-pay

## 2021-01-05 VITALS — BP 122/84 | HR 81 | Temp 97.8°F | Ht 71.0 in | Wt 216.6 lb

## 2021-01-05 DIAGNOSIS — R350 Frequency of micturition: Secondary | ICD-10-CM

## 2021-01-05 LAB — POCT URINALYSIS DIP (CLINITEK)
Bilirubin, UA: NEGATIVE
Blood, UA: NEGATIVE
Glucose, UA: NEGATIVE mg/dL
Ketones, POC UA: NEGATIVE mg/dL
Nitrite, UA: NEGATIVE
POC PROTEIN,UA: NEGATIVE
Spec Grav, UA: 1.015 (ref 1.010–1.025)
Urobilinogen, UA: 0.2 E.U./dL
pH, UA: 6 (ref 5.0–8.0)

## 2021-01-05 MED ORDER — NITROFURANTOIN MONOHYD MACRO 100 MG PO CAPS: 100.0000 mg | ORAL_CAPSULE | Freq: Two times a day (BID) | ORAL | 0 refills | Status: DC

## 2021-01-05 NOTE — Progress Notes (Signed)
Karen Cobb is a 60 y.o. female with the following history as recorded in EpicCare:  Patient Active Problem List   Diagnosis Date Noted   GERD (gastroesophageal reflux disease) 06/06/2017   Vitamin D deficiency 08/06/2016   Hx of melanoma in situ 08/01/2015   Hyperlipidemia 08/01/2015   Preventative health care 08/01/2015    Current Outpatient Medications  Medication Sig Dispense Refill   atorvastatin (LIPITOR) 10 MG tablet Take 1 tablet (10 mg total) by mouth daily. 90 tablet 3   calcium citrate-vitamin D (CITRACAL+D) 315-200 MG-UNIT tablet Take 1 tablet 1 day or 1 dose by mouth.     omeprazole (PRILOSEC) 20 MG capsule TAKE 1 CAPSULE (20 MG TOTAL) DAILY BY MOUTH 30 capsule 5   B Complex-Biotin-FA (SUPER B-COMPLEX PO) Take 1 tablet by mouth daily. (Patient not taking: Reported on 01/05/2021)     Calcium Carbonate-Vitamin D (CALTRATE 600+D PO) Take 1 tablet by mouth daily. (Patient not taking: Reported on 01/05/2021)     Cholecalciferol (VITAMIN D3) 75 MCG (3000 UT) TABS Take 1 tablet by mouth daily. (Patient not taking: Reported on 01/05/2021) 30 tablet    Multiple Vitamin (MULTIVITAMIN) tablet Take 1 tablet by mouth daily. (Patient not taking: Reported on 01/05/2021)     No current facility-administered medications for this visit.    Allergies: Patient has no known allergies.  Past Medical History:  Diagnosis Date   Cancer ([place]) 1999   melanoma   Hyperlipidemia    Melanoma ([place]) 09/27/1999   LEVEL II RIGHT FOOT - [place] BY DR. Benson [place]    Past Surgical History:  Procedure Laterality Date   ADENOIDECTOMY     eyelid lift surgery     MELANOMA EXCISION Right    foot   SHOULDER SURGERY Right 09/2016   TOOTH EXTRACTION     TUBAL LIGATION      Family History  Problem Relation Age of Onset   Cancer Mother        colon CA and uterine cancer 2015   Heart disease Father        CABG x 4    Hypertension Father    CVA Father        "mini strokes"   AAA (abdominal aortic aneurysm)  Father        s/p stent   Developmental delay Sister     Social History   Tobacco Use   Smoking status: Former    Pack years: 0.00   Smokeless tobacco: Never  Substance Use Topics   Alcohol use: Not Currently    Alcohol/week: 0.0 standard drinks    Comment: rarely    Subjective:   Concerned for possible UTI; complaining of urinary frequency x 2-3 days/ lower pelvic pressure; no vaginal discharge; sense of incomplete emptying;   Objective:  Vitals:   01/05/21 0844  BP: 122/84  Pulse: 81  Temp: 97.8 F (36.6 C)  TempSrc: Oral  SpO2: 99%  Weight: 216 lb 9.6 oz (98.2 kg)  Height: 5\' 11"  (1.803 m)    General: Well developed, well nourished, in no acute distress  Skin : Warm and dry.  Head: Normocephalic and atraumatic  Lungs: Respirations unlabored;  Abdomen: Soft; nontender; nondistended;  Neurologic: Alert and oriented; speech intact; face symmetrical; moves all extremities well; CNII-XII intact without focal deficit   Assessment:  1. Urinary frequency     Plan:  Suspect UTI; check U/A and urine culture; Rx for Macrobid 100 mg bid x 5 days; increase fluids, rest and  follow up worse, no better.  This visit occurred during the SARS-CoV-2 public health emergency.  Safety protocols were in place, including screening questions prior to the visit, additional usage of staff PPE, and extensive cleaning of exam room while observing appropriate contact time as indicated for disinfecting solutions.    No follow-ups on file.  Orders Placed This Encounter  Procedures   Urine Culture   POCT URINALYSIS DIP (CLINITEK)    Requested Prescriptions    No prescriptions requested or ordered in this encounter

## 2021-01-07 LAB — URINE CULTURE
MICRO NUMBER:: 12020902
SPECIMEN QUALITY:: ADEQUATE

## 2021-01-08 ENCOUNTER — Other Ambulatory Visit: Payer: Self-pay | Admitting: Family

## 2021-01-08 MED ORDER — PENICILLIN V POTASSIUM 500 MG PO TABS: 500.0000 mg | ORAL_TABLET | Freq: Three times a day (TID) | ORAL | 0 refills | Status: AC

## 2021-01-12 ENCOUNTER — Ambulatory Visit: Payer: 59 | Admitting: Family

## 2021-01-26 ENCOUNTER — Other Ambulatory Visit: Payer: Self-pay | Admitting: Family

## 2021-03-24 ENCOUNTER — Emergency Department (HOSPITAL_BASED_OUTPATIENT_CLINIC_OR_DEPARTMENT_OTHER): Payer: 59

## 2021-03-24 ENCOUNTER — Emergency Department (HOSPITAL_COMMUNITY): Payer: 59

## 2021-03-24 ENCOUNTER — Emergency Department (HOSPITAL_BASED_OUTPATIENT_CLINIC_OR_DEPARTMENT_OTHER)
Admission: EM | Admit: 2021-03-24 | Discharge: 2021-03-24 | Disposition: A | Discharge status: Home / Self Care | Payer: 59 | Attending: Emergency Medicine | Admitting: Emergency Medicine

## 2021-03-24 ENCOUNTER — Encounter (HOSPITAL_BASED_OUTPATIENT_CLINIC_OR_DEPARTMENT_OTHER): Payer: Self-pay | Admitting: *Deleted

## 2021-03-24 ENCOUNTER — Other Ambulatory Visit: Payer: Self-pay

## 2021-03-24 DIAGNOSIS — W06XXXA Fall from bed, initial encounter: Secondary | ICD-10-CM | POA: Insufficient documentation

## 2021-03-24 DIAGNOSIS — Z85820 Personal history of malignant melanoma of skin: Secondary | ICD-10-CM | POA: Insufficient documentation

## 2021-03-24 DIAGNOSIS — R202 Paresthesia of skin: Secondary | ICD-10-CM | POA: Diagnosis not present

## 2021-03-24 DIAGNOSIS — S199XXA Unspecified injury of neck, initial encounter: Secondary | ICD-10-CM | POA: Diagnosis present

## 2021-03-24 DIAGNOSIS — Z79899 Other long term (current) drug therapy: Secondary | ICD-10-CM | POA: Insufficient documentation

## 2021-03-24 DIAGNOSIS — W19XXXA Unspecified fall, initial encounter: Secondary | ICD-10-CM

## 2021-03-24 DIAGNOSIS — Z87891 Personal history of nicotine dependence: Secondary | ICD-10-CM | POA: Diagnosis not present

## 2021-03-24 NOTE — ED Triage Notes (Signed)
Pt standing on bed yesterday changing a light bulb. She went to sit down on bed and fell off backward and rolled back.Placed in c collar in triage due to cervical tenderness, numbness on and off in both arms ans hands.

## 2021-03-24 NOTE — ED Provider Notes (Signed)
[facility] EMERGENCY DEPT Provider Note   CSN: [id number] Arrival date & time: 03/24/21  1036     History Chief Complaint  Patient presents with   Karen Cobb is a 60 y.o. female with a past history of melanoma, who presents today for evaluation of neck injury.  She states that yesterday she was standing on a bed changing a light bulb and when she went to sit down she fell off the back of the bed.  She states that she felt her neck "roll up."  She states that originally she felt okay, however is the day went on she started noticing abnormal sensation in her shoulders.  This improved however she has had numbness in both of her hands, right significantly greater than left over the thumbs.  When I ask clarifying questions this is not true numbness, more of paresthesias and muted sensation.  She denies any blood thinner use or headache.  She is not having significant pain in her head, or bilateral shoulders/arms.  She does feel an abnormal feeling in her lower neck.   She does not have any implanted metal, no pacer or implanted defib.    HPI     Past Medical History:  Diagnosis Date   Cancer ([place]) 1999   melanoma   Hyperlipidemia    Melanoma ([place]) 09/27/1999   LEVEL II RIGHT FOOT - [place]    Patient Active Problem List   Diagnosis Date Noted   GERD (gastroesophageal reflux disease) 06/06/2017   Vitamin D deficiency 08/06/2016   Hx of melanoma in situ 08/01/2015   Hyperlipidemia 08/01/2015   Preventative health care 08/01/2015    Past Surgical History:  Procedure Laterality Date   ADENOIDECTOMY     eyelid lift surgery     MELANOMA EXCISION Right    foot   SHOULDER SURGERY Right 09/2016   TOOTH EXTRACTION     TUBAL LIGATION       OB History   No obstetric history on file.     Family History  Problem Relation Age of Onset   Cancer Mother        colon CA and uterine cancer 2015   Heart disease Father        CABG x 4     Hypertension Father    CVA Father        "mini strokes"   AAA (abdominal aortic aneurysm) Father        s/p stent   Developmental delay Sister     Social History   Tobacco Use   Smoking status: Former   Smokeless tobacco: Never  Scientific laboratory technician Use: Never used  Substance Use Topics   Alcohol use: Not Currently    Alcohol/week: 0.0 standard drinks    Comment: rarely   Drug use: No    Home Medications Prior to Admission medications   Medication Sig Start Date End Date Taking? Authorizing Provider  atorvastatin (LIPITOR) 10 MG tablet Take 1 tablet (10 mg total) by mouth daily. 08/30/20   Debbrah Alar, NP  B Complex-Biotin-FA (SUPER B-COMPLEX PO) Take 1 tablet by mouth daily. Patient not taking: Reported on 01/05/2021    [provider]  Calcium Carbonate-Vitamin D (CALTRATE 600+D PO) Take 1 tablet by mouth daily. Patient not taking: Reported on 01/05/2021    [provider]  calcium citrate-vitamin D (CITRACAL+D) 315-200 MG-UNIT tablet Take 1 tablet 1 day or 1 dose by mouth.  [provider]  Cholecalciferol (VITAMIN D3) 75 MCG (3000 UT) TABS Take 1 tablet by mouth daily. Patient not taking: Reported on 01/05/2021 11/22/20   Debbrah Alar, NP  Multiple Vitamin (MULTIVITAMIN) tablet Take 1 tablet by mouth daily. Patient not taking: Reported on 01/05/2021    [provider]  omeprazole (PRILOSEC) 20 MG capsule TAKE 1 CAPSULE (20 MG TOTAL) DAILY BY MOUTH 01/26/21   Debbrah Alar, NP    Allergies    Patient has no known allergies.  Review of Systems   Review of Systems  Constitutional:  Negative for chills and fever.  HENT:  Negative for congestion.   Respiratory:  Negative for chest tightness.   Cardiovascular:  Negative for chest pain.  Gastrointestinal:  Negative for abdominal pain.  Genitourinary:  Negative for dysuria.  Musculoskeletal:  Positive for neck pain (Slight over lower neck). Negative for back pain.  Skin:   Negative for color change and rash.  Neurological:  Positive for numbness. Negative for speech difficulty, weakness and headaches.  All other systems reviewed and are negative.  Physical Exam Updated Vital Signs BP 135/89 (BP Location: Right Arm)   Pulse 70   Temp 98.7 F (37.1 C)   Resp 16   Ht '5\' 11"'$  (1.803 m)   Wt 96.6 kg   SpO2 100%   BMI 29.71 kg/m   Physical Exam Vitals and nursing note reviewed.  Constitutional:      General: She is not in acute distress.    Appearance: She is not ill-appearing.  HENT:     Head: Normocephalic and atraumatic.     Comments: No racoon eyes or battle signs bilaterally.  Eyes:     Conjunctiva/sclera: Conjunctivae normal.  Neck:     Comments: C-collar placed in traige.  Mild lower C spine midline TTP.  No significant paraspinal muscle TTP.  ROM not tested Cardiovascular:     Rate and Rhythm: Normal rate.     Pulses: Normal pulses.  Pulmonary:     Effort: Pulmonary effort is normal. No respiratory distress.  Abdominal:     General: There is no distension.     Tenderness: There is no abdominal tenderness.  Musculoskeletal:     Comments: There is no TTP in bilateral cervical or upper T-spine paraspinal muscles.  There is no significant pain with bilateral shoulder range of motion.  Unable to recreate or exacerbate her paresthesias and sensation changes with percussion over the carpal tunnel or cubital tunnel.  Skin:    General: Skin is warm and dry.  Neurological:     Mental Status: She is alert.     Comments: Awake and alert, answers all questions appropriately.  Speech is not slurred.  Patient has 5/5 strength bilateral arms and legs.  She has subjective decrease sensation over the radial forearm and into the thumb, more on the right than the left with associated subjective paresthesias.  This does not extend up into the arms upper arms or proximal to elbow.   Movements are symmetric.  Sensation intact to bilateral legs with intact strength  in bilateral lower extremities.  Psychiatric:        Mood and Affect: Mood normal.        Behavior: Behavior normal.    ED Results / Procedures / Treatments   Labs (all labs ordered are listed, but only abnormal results are displayed) Labs Reviewed - No data to display  EKG None  Radiology CT Cervical Spine Wo Contrast  Result Date: 03/24/2021  CLINICAL DATA:  Fall, trauma, neck tenderness, paresthesias EXAM: CT CERVICAL SPINE WITHOUT CONTRAST TECHNIQUE: Multidetector CT imaging of the cervical spine was performed without intravenous contrast. Multiplanar CT image reconstructions were also generated. COMPARISON:  None. FINDINGS: Alignment: Normal. Skull base and vertebrae: No acute fracture. No primary bone lesion or focal pathologic process. Soft tissues and spinal canal: No prevertebral fluid or swelling. No visible canal hematoma. Disc levels: Preserved vertebral body heights and disc spaces. No significant degenerative disc disease. Minor degenerative changes C1-2 articulation. Facets are aligned. No subluxation or dislocation. Upper chest: Negative. Other: None. IMPRESSION: No acute cervical spine osseous abnormality, fracture or malalignment by CT. Electronically Signed   By: Jerilynn Mages.  Shick M.D.   On: 03/24/2021 11:58    Procedures Procedures   Medications Ordered in ED Medications - No data to display  ED Course  I have reviewed the triage vital signs and the nursing notes.  Pertinent labs & imaging results that were available during my care of the patient were reviewed by me and considered in my medical decision making (see chart for details).  Clinical Course as of 03/24/21 2224  Sat Mar 24, 2021  1346 I spoke with [facility].  [EH]  [id number] I spoke with patient.  We discussed transfer and she is in agreement.  She wishes for her sister to drive her.  We did discuss option of [facility] transfer, and discussed risks and benefits of [facility] versus [facility].  She is  adamant that she does not want to go by [facility] and wants her sister to take her.  Given that she does not appear to have a unstable osseous injury will allow her sister to take her ED to ED.  [EH]    Clinical Course User Index [EH] Ollen Gross   MDM Rules/Calculators/A&P                          Patient is a 60 year old woman who presents today for evaluation of paresthesias in her arms, right worse than left, after a fall with hyperflexion neck injury that occurred yesterday.  She does not take any blood thinning medications and did not pass out.  On exam she has subjective decrease sensation to light touch on bilateral arms primarily over the thumb and radial aspect of the forearm right more severe than left.  CT C-spine is obtained without acute osseous abnormalities.  She does not have a clearly peripheral nerve pattern or compression that I can exacerbate with percussion or range of motion.  Given the bilateral nature of her parasthesias with recent fall and injury I am concerned that she may have a cord compression or injury.  Additionally on my exam I am unable to determine a clear peripheral distribution or pattern to her symptoms.   I discussed patient with Dr. Tamera Punt, feel it is reasonable given her mechanism and ongoing symptoms to transfer patient to [facility] for [facility]. I spoke with Dr. Kathrynn Humble who accepts patient in transfer.  Patient transferred POV to [facility] for MRI.  Note: Portions of this report may have been transcribed using voice recognition software. Every effort was made to ensure accuracy; however, inadvertent computerized transcription errors may be present   Final Clinical Impression(s) / ED Diagnoses Final diagnoses:  Fall, initial encounter  Paresthesia of arm    Rx / DC Orders ED Discharge Orders     None  Lorin Glass, PA-C 03/24/21 2225    Malvin Johns, MD 03/25/21 [phone]

## 2021-03-24 NOTE — ED Notes (Signed)
Pt aware to go to [facility] ED, Dr Kathrynn Humble  Accepting EDP.

## 2021-03-24 NOTE — ED Provider Notes (Signed)
Update note  60 year old lady presented to ER with concern for tingling sensation in both of her arms after neck trauma.  CT C-spine was negative.  Sent to [facility] for MRI.  MRI negative for acute traumatic pathology.  Radiologist commented on mild left foraminal narrowing at C3-C4.  Also commented on a small hyperintense lesion in the inferior aspect of T1, likely hemangioma benign and recommended MRI follow-up in 6 months.  I have reassessed patient, she denies any ongoing paresthesias.  She has 5 out of 5 strength throughout all muscle groups in her upper and lower extremity.  Sensation to light touch intact throughout upper and lower extremity.  Given no neurologic complaint and reassuring MRI, believe she can be discharged and managed in the outpatient setting.  Discussed need for repeat MRI.  Provided information for both neurosurgery and primary care.  Discharged home.   Karen Starch, MD 03/24/21 [phone]

## 2021-03-24 NOTE — ED Notes (Signed)
Report called to [place] Nurse RN at [facility]

## 2021-03-24 NOTE — Discharge Instructions (Addendum)
Follow-up with your primary care doctor.  The radiologist commented on likely a benign hemangioma in your T1 vertebrae and recommended repeat MRI with and without contrast in 6 months to ensure stability.  They can order this.  If you continue to have any neck discomfort or tingling sensation in your arms, I would recommend following up with the neurosurgeons.  If you develop frank numbness, weakness or other new concerning symptom, please come back to ER for reassessment.

## 2021-04-24 ENCOUNTER — Encounter: Payer: Self-pay | Admitting: Family

## 2021-04-24 DIAGNOSIS — Z8249 Family history of ischemic heart disease and other diseases of the circulatory system: Secondary | ICD-10-CM

## 2021-05-21 ENCOUNTER — Other Ambulatory Visit: Payer: Self-pay

## 2021-05-21 ENCOUNTER — Ambulatory Visit (INDEPENDENT_AMBULATORY_CARE_PROVIDER_SITE_OTHER): Payer: 59 | Admitting: Dermatology

## 2021-05-21 ENCOUNTER — Encounter: Payer: Self-pay | Admitting: Dermatology

## 2021-05-21 DIAGNOSIS — Z86018 Personal history of other benign neoplasm: Secondary | ICD-10-CM

## 2021-05-21 DIAGNOSIS — D485 Neoplasm of uncertain behavior of skin: Secondary | ICD-10-CM

## 2021-05-21 DIAGNOSIS — Z85820 Personal history of malignant melanoma of skin: Secondary | ICD-10-CM

## 2021-05-21 DIAGNOSIS — L281 Prurigo nodularis: Secondary | ICD-10-CM

## 2021-05-21 DIAGNOSIS — L570 Actinic keratosis: Secondary | ICD-10-CM

## 2021-05-21 DIAGNOSIS — I781 Nevus, non-neoplastic: Secondary | ICD-10-CM

## 2021-05-21 DIAGNOSIS — L821 Other seborrheic keratosis: Secondary | ICD-10-CM | POA: Diagnosis not present

## 2021-05-21 DIAGNOSIS — L430 Hypertrophic lichen planus: Secondary | ICD-10-CM

## 2021-05-21 DIAGNOSIS — Z1283 Encounter for screening for malignant neoplasm of skin: Secondary | ICD-10-CM | POA: Diagnosis not present

## 2021-05-21 DIAGNOSIS — Z808 Family history of malignant neoplasm of other organs or systems: Secondary | ICD-10-CM

## 2021-05-21 NOTE — Patient Instructions (Signed)

## 2021-06-01 ENCOUNTER — Other Ambulatory Visit: Payer: Self-pay

## 2021-06-01 ENCOUNTER — Encounter: Payer: Self-pay | Admitting: Cardiology

## 2021-06-01 ENCOUNTER — Ambulatory Visit (INDEPENDENT_AMBULATORY_CARE_PROVIDER_SITE_OTHER): Payer: 59 | Admitting: Cardiology

## 2021-06-01 VITALS — BP 128/90 | HR 107 | Ht 71.0 in | Wt 217.0 lb

## 2021-06-01 DIAGNOSIS — Z0000 Encounter for general adult medical examination without abnormal findings: Secondary | ICD-10-CM

## 2021-06-01 DIAGNOSIS — R9431 Abnormal electrocardiogram [ECG] [EKG]: Secondary | ICD-10-CM | POA: Insufficient documentation

## 2021-06-01 DIAGNOSIS — E785 Hyperlipidemia, unspecified: Secondary | ICD-10-CM

## 2021-06-01 DIAGNOSIS — R0609 Other forms of dyspnea: Secondary | ICD-10-CM | POA: Insufficient documentation

## 2021-06-01 NOTE — Progress Notes (Signed)
Cardiology Consultation:    Date:  06/01/2021   ID:  ARSHI DUARTE, DOB 01-11-1961, MRN [id number]  PCP:  Debbrah Alar, NP  Cardiologist:  Jenne Campus, MD   Referring MD: Debbrah Alar, NP   Chief Complaint  Patient presents with   Establish Care    Fam hx of heart disease. Patient cousin died of heart attack suddenly (93's)    History of Present Illness:    Karen Cobb is a 60 y.o. female who is being seen today for the evaluation of I would like him to check to make sure my heart is okay at the request of Debbrah Alar, NP.  With past medical history significant for essential hypertension, dyslipidemia few weeks ago her cousin died because of acute heart attack in her 2s she got scared so much because of this that she would like to be checked make sure everything is fine she also has family history of coronary artery disease before however not premature.  Multiple family members smoke.  Overall she seems to be doing well she used to be very active and lipidemia strike she is to go to gym on the regular basis but now she does not she walks sometimes but when she walks uphill she will feel her heart beating forcefully and fast that usually scares her and she slows down.  She does have any chest pain tightness squeezing pressure burning chest even while walking uphill.  Chest shortness of breath and fatigue. She quit smoking many years ago. she does take cholesterol medication She does not exercise on the regular basis  Past Medical History:  Diagnosis Date   Cancer ([place]) 1999   melanoma   Hyperlipidemia    Melanoma ([place]) 09/27/1999   LEVEL II RIGHT FOOT - [place]    Past Surgical History:  Procedure Laterality Date   ADENOIDECTOMY     eyelid lift surgery     MELANOMA EXCISION Right    foot   SHOULDER SURGERY Right 09/2016   TOOTH EXTRACTION     TUBAL LIGATION      Current Medications: Current Meds  Medication Sig   atorvastatin  (LIPITOR) 10 MG tablet Take 1 tablet (10 mg total) by mouth daily.   calcium citrate-vitamin D (CITRACAL+D) 315-200 MG-UNIT tablet Take 1 tablet 1 day or 1 dose by mouth.   Multiple Vitamin (MULTIVITAMIN) tablet Take 1 tablet by mouth daily. Unknown strenght   omeprazole (PRILOSEC) 20 MG capsule TAKE 1 CAPSULE (20 MG TOTAL) DAILY BY MOUTH     Allergies:   Patient has no known allergies.   Social History   Socioeconomic History   Marital status: Married    Spouse name: Not on file   Number of children: Not on file   Years of education: Not on file   Highest education level: Not on file  Occupational History   Not on file  Tobacco Use   Smoking status: Former   Smokeless tobacco: Never  Vaping Use   Vaping Use: Never used  Substance and Sexual Activity   Alcohol use: Not Currently    Alcohol/week: 0.0 standard drinks    Comment: rarely   Drug use: No   Sexual activity: Yes  Other Topics Concern   Not on file  Social History Narrative   Works at [employer]   Married   One son- born 1998   One step son, one step daughter (they are grown)  Enjoys going to the [place].     Completed HS    Social Determinants of Health   Financial Resource Strain: Not on file  Food Insecurity: Not on file  Transportation Needs: Not on file  Physical Activity: Not on file  Stress: Not on file  Social Connections: Not on file     Family History: The patient's family history includes AAA (abdominal aortic aneurysm) in her father; CVA in her father; Cancer in her mother; Developmental delay in her sister; Heart disease in her father; Hypertension in her father. ROS:   Please see the history of present illness.    All 14 point review of systems negative except as described per history of present illness.  EKGs/Labs/Other Studies Reviewed:    The following studies were reviewed today:   EKG:  EKG is  ordered today.  The ekg ordered  today demonstrates normal sinus rhythm, normal P interval, cannot rule out anterior wall microinfarction, nonspecific ST segment changes  Recent Labs: 08/30/2020: ALT 15; BUN 18; Creatinine, Ser 0.86; Potassium 5.0; Sodium 142  Recent Lipid Panel    Component Value Date/Time   CHOL 180 08/30/2020 0859   TRIG 64.0 08/30/2020 0859   HDL 55.20 08/30/2020 0859   CHOLHDL 3 08/30/2020 0859   VLDL 12.8 08/30/2020 0859   LDLCALC 112 (H) 08/30/2020 0859    Physical Exam:    VS:  BP 128/90 (BP Location: Left Arm, Patient Position: Sitting)   Pulse (!) 107   Ht 5\' 11"  (1.803 m)   Wt 217 lb (98.4 kg)   SpO2 100%   BMI 30.27 kg/m     Wt Readings from Last 3 Encounters:  06/01/21 217 lb (98.4 kg)  03/24/21 213 lb (96.6 kg)  01/05/21 216 lb 9.6 oz (98.2 kg)     GEN:  Well nourished, well developed in no acute distress HEENT: Normal NECK: No JVD; No carotid bruits LYMPHATICS: No lymphadenopathy CARDIAC: RRR, no murmurs, no rubs, no gallops RESPIRATORY:  Clear to auscultation without rales, wheezing or rhonchi  ABDOMEN: Soft, non-tender, non-distended MUSCULOSKELETAL:  No edema; No deformity  SKIN: Warm and dry NEUROLOGIC:  Alert and oriented x 3 PSYCHIATRIC:  Normal affect   ASSESSMENT:    1. Hyperlipidemia, unspecified hyperlipidemia type   2. Dyspnea on exertion   3. Preventative health care   4. Nonspecific abnormal electrocardiogram (ECG) (EKG)    PLAN:    In order of problems listed above:  Lady came to be checked for her heart issues.  Her cousin died suddenly recently and she is very scared of the situation.  We did discuss in length what to do with the situation.  She does have mildly abnormal EKG therefore echocardiogram will be warranted to make sure that she does have no segmental motion abnormalities.  However she does not have any symptomatology suggest active coronary disease.  I think she can benefit from calcium score and she actually want to have a calcium score  done and I will schedule her to have the test done. Dyslipidemia she takes Lipitor 10 mg daily.  Her LDL was 112 HDL 55 this is from 08/30/2020.  I will schedule him to have direct LDL today.  In terms of aggressiveness of the therapy will decide after we get her calcium score back. We spent a great of time talking about healthy lifestyle.  We did talk about need to exercise on the regular basis 5 times a week for at least 30 minutes.  She  does have a dog and I advised her to walk with the dog.  We did discuss basic of Mediterranean diet.  She is willing to try. Essential hypertension first visit to my office blood pressure slightly elevated we will continue monitoring it.   Medication Adjustments/Labs and Tests Ordered: Current medicines are reviewed at length with the patient today.  Concerns regarding medicines are outlined above.  No orders of the defined types were placed in this encounter.  No orders of the defined types were placed in this encounter.   Signed, Park Liter, MD, [facility]. 06/01/2021 3:08 PM    [place]

## 2021-06-01 NOTE — Patient Instructions (Signed)
Medication Instructions:  Your physician recommends that you continue on your current medications as directed. Please refer to the Current Medication list given to you today.  *If you need a refill on your cardiac medications before your next appointment, please call your pharmacy*   Lab Work: None If you have labs (blood work) drawn today and your tests are completely normal, you will receive your results only by: [facility] (if you have MyChart) OR A paper copy in the mail If you have any lab test that is abnormal or we need to change your treatment, we will call you to review the results.   Testing/Procedures: Your physician has requested that you have an echocardiogram. Echocardiography is a painless test that uses sound waves to create images of your heart. It provides your doctor with information about the size and shape of your heart and how well your heart's chambers and valves are working. This procedure takes approximately one hour. There are no restrictions for this procedure.  Non-Cardiac CT scanning, (CAT scanning), is a noninvasive, special x-ray that produces cross-sectional images of the body using x-rays and a computer. CT scans help physicians diagnose and treat medical conditions. For some CT exams, a contrast material is used to enhance visibility in the area of the body being studied. CT scans provide greater clarity and reveal more details than regular x-ray exams.    Follow-Up: At [facility], you and your health needs are our priority.  As part of our continuing mission to provide you with exceptional heart care, we have created designated Provider Care Teams.  These Care Teams include your primary Cardiologist (physician) and Advanced Practice Providers (APPs -  Physician Assistants and Nurse Practitioners) who all work together to provide you with the care you need, when you need it.  We recommend signing up for the patient portal called "MyChart".  Sign up  information is provided on this After Visit Summary.  MyChart is used to connect with patients for Virtual Visits (Telemedicine).  Patients are able to view lab/test results, encounter notes, upcoming appointments, etc.  Non-urgent messages can be sent to your provider as well.   To learn more about what you can do with MyChart, go to [email or web address].    Your next appointment:   2 month(s)  The format for your next appointment:   In Person  Provider:   Jenne Campus, MD    Other Instructions  Echocardiogram An echocardiogram is a test that uses sound waves (ultrasound) to produce images of the heart. Images from an echocardiogram can provide important information about: Heart size and shape. The size and thickness and movement of your heart's walls. Heart muscle function and strength. Heart valve function or if you have stenosis. Stenosis is when the heart valves are too narrow. If blood is flowing backward through the heart valves (regurgitation). A tumor or infectious growth around the heart valves. Areas of heart muscle that are not working well because of poor blood flow or injury from a heart attack. Aneurysm detection. An aneurysm is a weak or damaged part of an artery wall. The wall bulges out from the normal force of blood pumping through the body. Tell a health care provider about: Any allergies you have. All medicines you are taking, including vitamins, herbs, eye drops, creams, and over-the-counter medicines. Any blood disorders you have. Any surgeries you have had. Any medical conditions you have. Whether you are pregnant or may be pregnant. What are the risks? Generally, this is  a safe test. However, problems may occur, including an allergic reaction to dye (contrast) that may be used during the test. What happens before the test? No specific preparation is needed. You may eat and drink normally. What happens during the test?  You will take off your  clothes from the waist up and put on a hospital gown. Electrodes or electrocardiogram (ECG)patches may be placed on your chest. The electrodes or patches are then connected to a device that monitors your heart rate and rhythm. You will lie down on a table for an ultrasound exam. A gel will be applied to your chest to help sound waves pass through your skin. A handheld device, called a transducer, will be pressed against your chest and moved over your heart. The transducer produces sound waves that travel to your heart and bounce back (or "echo" back) to the transducer. These sound waves will be captured in real-time and changed into images of your heart that can be viewed on a video monitor. The images will be recorded on a computer and reviewed by your health care provider. You may be asked to change positions or hold your breath for a short time. This makes it easier to get different views or better views of your heart. In some cases, you may receive contrast through an IV in one of your veins. This can improve the quality of the pictures from your heart. The procedure may vary among health care providers and hospitals. What can I expect after the test? You may return to your normal, everyday life, including diet, activities, and medicines, unless your health care provider tells you not to do that. Follow these instructions at home: It is up to you to get the results of your test. Ask your health care provider, or the department that is doing the test, when your results will be ready. Keep all follow-up visits. This is important. Summary An echocardiogram is a test that uses sound waves (ultrasound) to produce images of the heart. Images from an echocardiogram can provide important information about the size and shape of your heart, heart muscle function, heart valve function, and other possible heart problems. You do not need to do anything to prepare before this test. You may eat and drink  normally. After the echocardiogram is completed, you may return to your normal, everyday life, unless your health care provider tells you not to do that. This information is not intended to replace advice given to you by your health care provider. Make sure you discuss any questions you have with your health care provider. Document Revised: 03/21/2021 Document Reviewed: 02/29/2020 Elsevier Patient Education  2022 [employer].

## 2021-06-02 LAB — LDL CHOLESTEROL, DIRECT: LDL Direct: 118 mg/dL — ABNORMAL HIGH (ref 0–99)

## 2021-06-04 ENCOUNTER — Telehealth: Payer: Self-pay | Admitting: Cardiology

## 2021-06-04 NOTE — Telephone Encounter (Signed)
A user error has taken place: encounter opened in error, closed for administrative reasons.

## 2021-06-05 ENCOUNTER — Encounter: Payer: Self-pay | Admitting: Dermatology

## 2021-06-05 NOTE — Progress Notes (Signed)
   Follow-Up Visit   Subjective  Karen Cobb is a 60 y.o. female who presents for the following: Annual Exam (Patient here today for yearly skin check. Per patient she has a lesion on her right shoulder x 1 year bleeding when scratching, itchy and dry. Personal history of atypical moles and melanoma. Family history of non mole skin cancer.).  Chief concern is growth on back, left shoulder but other areas to be checked Location:  Duration:  Quality:  Associated Signs/Symptoms: Modifying Factors:  Severity:  Timing: Context:   Objective  Well appearing patient in no apparent distress; mood and affect are within normal limits. Right Popliteal Fossa 4 mm tan textured flattopped papule  Right Shoulder - anterior Lichenified waxy 1 cm crust with small central erosion.  Will obtain biopsy to rule out carcinoma but this may be an irritated keratosis.       Head - Anterior (Face) Erythematous patches with gritty scale.  Mid Tip of Nose To millimeter macular telangiectasia which easily blanches with light pressure; no dermoscopic atypia to suggest skin cancer.    A focused examination was performed including head, neck, face, back, arms, upper chest.. Relevant physical exam findings are noted in the Assessment and Plan.   Assessment & Plan    Seborrheic keratosis Right Popliteal Fossa  Leave if stable  Neoplasm of uncertain behavior of skin Right Shoulder - anterior  Skin / nail biopsy Type of biopsy: tangential   Informed consent: discussed and consent obtained   Timeout: patient name, date of birth, surgical site, and procedure verified   Anesthesia: the lesion was anesthetized in a standard fashion   Anesthetic:  1% lidocaine w/ epinephrine 1-100,000 local infiltration Instrument used: flexible razor blade   Hemostasis achieved with: ferric subsulfate   Outcome: patient tolerated procedure well   Post-procedure details: wound care instructions given     Specimen 1 - Surgical pathology Differential Diagnosis: scc vs bcc  Check Margins: No  AK (actinic keratosis) Head - Anterior (Face)  Destruction of lesion - Head - Anterior (Face) Complexity: simple   Destruction method: cryotherapy   Informed consent: discussed and consent obtained   Timeout:  patient name, date of birth, surgical site, and procedure verified Lesion destroyed using liquid nitrogen: Yes   Cryotherapy cycles:  1 Outcome: patient tolerated procedure well with no complications   Post-procedure details: wound care instructions given    Telangiectasia Mid Tip of Nose  Eletrocautery with 30-gauge needle with fair success and good tolerance.      I, Lavonna Monarch, MD, have reviewed all documentation for this visit.  The documentation on 06/05/21 for the exam, diagnosis, procedures, and orders are all accurate and complete.

## 2021-06-06 ENCOUNTER — Telehealth: Payer: Self-pay | Admitting: Cardiology

## 2021-06-06 NOTE — Telephone Encounter (Signed)
Patient returning call for lab results. 

## 2021-06-06 NOTE — Telephone Encounter (Signed)
Patient informed of results.  

## 2021-07-02 ENCOUNTER — Other Ambulatory Visit: Payer: Self-pay

## 2021-07-02 ENCOUNTER — Ambulatory Visit (HOSPITAL_BASED_OUTPATIENT_CLINIC_OR_DEPARTMENT_OTHER)
Admission: RE | Admit: 2021-07-02 | Discharge: 2021-07-02 | Disposition: A | Discharge status: Home / Self Care | Payer: 59 | Source: Ambulatory Visit | Attending: Cardiology | Admitting: Cardiology

## 2021-07-02 DIAGNOSIS — E785 Hyperlipidemia, unspecified: Secondary | ICD-10-CM

## 2021-07-02 DIAGNOSIS — R0609 Other forms of dyspnea: Secondary | ICD-10-CM

## 2021-07-02 DIAGNOSIS — R9431 Abnormal electrocardiogram [ECG] [EKG]: Secondary | ICD-10-CM | POA: Diagnosis present

## 2021-07-02 DIAGNOSIS — Z0000 Encounter for general adult medical examination without abnormal findings: Secondary | ICD-10-CM

## 2021-07-02 LAB — ECHOCARDIOGRAM COMPLETE
AR max vel: 2.13 cm2
AV Area VTI: 2.29 cm2
AV Area mean vel: 1.97 cm2
AV Mean grad: 6 mmHg
AV Peak grad: 10.1 mmHg
Ao pk vel: 1.59 m/s
Area-P 1/2: 5.23 cm2
Calc EF: 65.7 %
P 1/2 time: 640 msec
S' Lateral: 2.7 cm
Single Plane A2C EF: 54.4 %
Single Plane A4C EF: 76 %

## 2021-07-04 ENCOUNTER — Ambulatory Visit (INDEPENDENT_AMBULATORY_CARE_PROVIDER_SITE_OTHER)
Admission: RE | Admit: 2021-07-04 | Discharge: 2021-07-04 | Disposition: A | Discharge status: Home / Self Care | Payer: Self-pay | Source: Ambulatory Visit | Attending: Cardiology | Admitting: Cardiology

## 2021-07-04 ENCOUNTER — Other Ambulatory Visit: Payer: Self-pay

## 2021-07-04 DIAGNOSIS — R9431 Abnormal electrocardiogram [ECG] [EKG]: Secondary | ICD-10-CM

## 2021-07-04 DIAGNOSIS — R0609 Other forms of dyspnea: Secondary | ICD-10-CM

## 2021-07-04 DIAGNOSIS — Z0000 Encounter for general adult medical examination without abnormal findings: Secondary | ICD-10-CM

## 2021-07-04 DIAGNOSIS — E785 Hyperlipidemia, unspecified: Secondary | ICD-10-CM

## 2021-07-05 ENCOUNTER — Telehealth: Payer: Self-pay | Admitting: Cardiology

## 2021-07-05 NOTE — Telephone Encounter (Signed)
Patient is returning call to discuss CT results. 

## 2021-07-05 NOTE — Telephone Encounter (Signed)
Patient informed of results.  

## 2021-07-18 ENCOUNTER — Other Ambulatory Visit: Payer: Self-pay | Admitting: Family

## 2021-08-07 ENCOUNTER — Ambulatory Visit: Payer: 59 | Admitting: Dermatology

## 2021-08-24 ENCOUNTER — Ambulatory Visit: Payer: 59 | Admitting: Cardiology

## 2021-08-31 ENCOUNTER — Encounter: Payer: 59 | Admitting: Family

## 2021-09-03 ENCOUNTER — Ambulatory Visit (INDEPENDENT_AMBULATORY_CARE_PROVIDER_SITE_OTHER): Payer: 59 | Admitting: Family

## 2021-09-03 ENCOUNTER — Encounter: Payer: Self-pay | Admitting: Family

## 2021-09-03 VITALS — BP 129/78 | HR 79 | Temp 97.7°F | Resp 16 | Ht 71.0 in | Wt 216.0 lb

## 2021-09-03 DIAGNOSIS — Z8349 Family history of other endocrine, nutritional and metabolic diseases: Secondary | ICD-10-CM | POA: Diagnosis not present

## 2021-09-03 DIAGNOSIS — Z0000 Encounter for general adult medical examination without abnormal findings: Secondary | ICD-10-CM | POA: Diagnosis not present

## 2021-09-03 DIAGNOSIS — R748 Abnormal levels of other serum enzymes: Secondary | ICD-10-CM

## 2021-09-03 DIAGNOSIS — N889 Noninflammatory disorder of cervix uteri, unspecified: Secondary | ICD-10-CM | POA: Diagnosis not present

## 2021-09-03 LAB — TSH: TSH: 4.11 u[IU]/mL (ref 0.35–5.50)

## 2021-09-03 NOTE — Patient Instructions (Signed)
Please complete lab work prior to leaving.   

## 2021-09-03 NOTE — Progress Notes (Signed)
Subjective:   By signing my name below, I, Zite Okoli, attest that this documentation has been prepared under the direction and in the presence of Debbrah Alar, NP 09/03/2021     Patient ID: Karen Cobb, female    DOB: 1961/01/19, 61 y.o.   MRN: [id number]  Chief Complaint  Patient presents with   Annual Exam    HPI Patient is in today for a comprehensive physical exam.  Her mother recently passed away on 08/29/21. She is grieving this loss as it is still "very fresh."   Hypertension- Her blood pressure is stable at this visit. BP Readings from Last 3 Encounters:  09/03/21 129/78  06/01/21 128/90  03/24/21 130/82     Mammogram- Last completed on 09/05/2020. Results were normal. Repeat in 1 year.  Pap Smear- Last checked on 07/19/2019. Results were normal. Repeat in 3-5 years.  Dexa- Last checked on 08/27/2019. Patient was considered normal according to the [facility]. Repeat in 2 years.  Colonoscopy- Last completed on 07/07/2017. Few small-mouthed diverticula were found in the sigmoid colon. Otherwise exam was normal. Repeat in 5 years.  Immunizations- She is UTD on tetanus vaccines.  Diet and Exercise- She has not been exercising regularly because she has been taking care of her mother. She plans to start back on it.  Vision and Dental- She is UTD on dental and vision  Social History- She rarely drinks alcohol. She does not smoke or use tobacco products.  She had recent finding of cervical lesion on MRI 5 months ago. Follow up MRI was recommended by radiology in 6 months.   No recent surgeries. No changes to family medical history except mother's death.  She denies having any unexpected weight change, ear pain, hearing loss and rhinorrhea, visual disturbance, cough, chest pain and leg swelling, nausea, vomiting, diarrhea and blood in stool, or dysuria and frequency, for myalgias and arthralgias, rash, headaches, adenopathy, depression or  anxiety at this time   Past Medical History:  Diagnosis Date   Cancer ([place]) 1999   melanoma   Hyperlipidemia    Melanoma ([place]) 09/27/1999   LEVEL II RIGHT FOOT - [place] BY DR. Benson [place]    Past Surgical History:  Procedure Laterality Date   ADENOIDECTOMY     eyelid lift surgery     MELANOMA EXCISION Right    foot   SHOULDER SURGERY Right 09/2016   TOOTH EXTRACTION     TUBAL LIGATION      Family History  Problem Relation Age of Onset   Colon cancer Mother    Heart disease Father        CABG x 4    Hypertension Father    CVA Father        "mini strokes"   AAA (abdominal aortic aneurysm) Father        s/p stent   Developmental delay Sister     Social History   Socioeconomic History   Marital status: Married    Spouse name: Not on file   Number of children: Not on file   Years of education: Not on file   Highest education level: Not on file  Occupational History   Not on file  Tobacco Use   Smoking status: Former   Smokeless tobacco: Never  Vaping Use   Vaping Use: Never used  Substance and Sexual Activity   Alcohol use: Not Currently    Alcohol/week: 0.0 standard drinks    Comment: rarely   Drug use: No  Sexual activity: Yes    Partners: Male  Other Topics Concern   Not on file  Social History Narrative   Works at [employer]   Married   One son- born 1998   One step son, one step daughter (they are grown)    Enjoys going to the [employer].     Completed HS    Social Determinants of Health   Financial Resource Strain: Not on file  Food Insecurity: Not on file  Transportation Needs: Not on file  Physical Activity: Not on file  Stress: Not on file  Social Connections: Not on file  Intimate Partner Violence: Not on file    Outpatient Medications Prior to Visit  Medication Sig Dispense Refill   atorvastatin (LIPITOR) 10 MG tablet Take 1 tablet (10 mg total) by mouth daily. 90 tablet 3   calcium  citrate-vitamin D (CITRACAL+D) 315-200 MG-UNIT tablet Take 1 tablet 1 day or 1 dose by mouth.     Multiple Vitamin (MULTIVITAMIN) tablet Take 1 tablet by mouth daily. Unknown strenght     omeprazole (PRILOSEC) 20 MG capsule TAKE 1 CAPSULE (20 MG TOTAL) DAILY BY MOUTH 90 capsule 1   No facility-administered medications prior to visit.    No Known Allergies  Review of Systems  Constitutional:  Negative for fever.  HENT:  Negative for ear pain and hearing loss.        (-)nystagmus (-)adenopathy  Eyes:  Negative for blurred vision.  Respiratory:  Negative for cough, shortness of breath and wheezing.   Cardiovascular:  Negative for chest pain and leg swelling.  Gastrointestinal:  Negative for blood in stool, diarrhea, nausea and vomiting.  Genitourinary:  Negative for dysuria and frequency.  Musculoskeletal:  Negative for joint pain and myalgias.  Skin:  Negative for rash.  Neurological:  Negative for headaches.  Psychiatric/Behavioral:  Negative for depression. The patient is not nervous/anxious.       Objective:    Physical Exam Constitutional:      General: She is not in acute distress.    Appearance: Normal appearance. She is not ill-appearing.  HENT:     Head: Normocephalic and atraumatic.     Right Ear: Tympanic membrane, ear canal and external ear normal.     Left Ear: Tympanic membrane, ear canal and external ear normal.  Eyes:     Extraocular Movements: Extraocular movements intact.     Right eye: No nystagmus.     Left eye: No nystagmus.     Pupils: Pupils are equal, round, and reactive to light.  Cardiovascular:     Rate and Rhythm: Normal rate and regular rhythm.     Pulses: Normal pulses.     Heart sounds: Normal heart sounds. No murmur heard. Pulmonary:     Effort: Pulmonary effort is normal. No respiratory distress.     Breath sounds: Normal breath sounds. No wheezing or rhonchi.  Chest:  Breasts:    Right: Normal. No mass.     Left: Normal. No mass.   Abdominal:     General: Bowel sounds are normal. There is no distension.     Palpations: Abdomen is soft.     Tenderness: There is no abdominal tenderness. There is no guarding or rebound.  Musculoskeletal:     Cervical back: Neck supple.     Comments: 5/5 strength in upper and lower extremities  Lymphadenopathy:     Cervical: No cervical adenopathy.  Skin:    General:  Skin is warm and dry.  Neurological:     Mental Status: She is alert and oriented to person, place, and time.     Deep Tendon Reflexes:     Reflex Scores:      Patellar reflexes are 2+ on the right side and 2+ on the left side. Psychiatric:        Behavior: Behavior normal.        Judgment: Judgment normal.    BP 129/78 (BP Location: Right Arm, Patient Position: Sitting, Cuff Size: Large)    Pulse 79    Temp 97.7 F (36.5 C) (Oral)    Resp 16    Ht 5' 11"  (1.803 m)    Wt 216 lb (98 kg)    SpO2 100%    BMI 30.13 kg/m  Wt Readings from Last 3 Encounters:  09/03/21 216 lb (98 kg)  06/01/21 217 lb (98.4 kg)  03/24/21 213 lb (96.6 kg)       Assessment & Plan:   Problem List Items Addressed This Visit       Unprioritized   Preventative health care - Primary    Encouraged healthy diet, exercise and weight loss. Mammo will be due soon, order placed.  Pap/colo up to date until later this year.        Relevant Orders   MM 3D SCREEN BREAST BILATERAL   Other Visit Diagnoses     Cervical lesion       Relevant Orders   MR CERVICAL SPINE W WO CONTRAST   Family history of hypothyroidism       Relevant Orders   TSH   Elevated alkaline phosphatase level       Relevant Orders   Comp Met (CMET)       No orders of the defined types were placed in this encounter.   I,Zite Okoli,acting as a Education administrator for [employer], NP.,have documented all relevant documentation on the behalf of Nance Pear, NP,as directed by  Nance Pear, NP while in the presence of Nance Pear, NP.   I,  Debbrah Alar, NP, personally preformed the services described in this documentation.  All medical record entries made by the scribe were at my direction and in my presence.  I have reviewed the chart and discharge instructions (if applicable) and agree that the record reflects my personal performance and is accurate and complete. 09/03/2021

## 2021-09-03 NOTE — Assessment & Plan Note (Signed)
Encouraged healthy diet, exercise and weight loss. Mammo will be due soon, order placed.  Pap/colo up to date until later this year.

## 2021-09-04 LAB — COMPREHENSIVE METABOLIC PANEL
ALT: 15 U/L (ref 0–35)
AST: 18 U/L (ref 0–37)
Albumin: 4.3 g/dL (ref 3.5–5.2)
Alkaline Phosphatase: 107 U/L (ref 39–117)
BUN: 16 mg/dL (ref 6–23)
CO2: 30 mEq/L (ref 19–32)
Calcium: 9.7 mg/dL (ref 8.4–10.5)
Chloride: 107 mEq/L (ref 96–112)
Creatinine, Ser: 0.86 mg/dL (ref 0.40–1.20)
GFR: 73.84 mL/min (ref >60.00)
Glucose, Bld: 90 mg/dL (ref 70–99)
Potassium: 4.8 mEq/L (ref 3.5–5.1)
Sodium: 141 mEq/L (ref 135–145)
Total Bilirubin: 0.3 mg/dL (ref 0.2–1.2)
Total Protein: 6.3 g/dL (ref 6.0–8.3)

## 2021-09-11 ENCOUNTER — Other Ambulatory Visit: Payer: Self-pay

## 2021-09-11 ENCOUNTER — Ambulatory Visit (INDEPENDENT_AMBULATORY_CARE_PROVIDER_SITE_OTHER): Payer: 59 | Admitting: Cardiology

## 2021-09-11 ENCOUNTER — Encounter: Payer: Self-pay | Admitting: Cardiology

## 2021-09-11 VITALS — BP 112/80 | HR 90 | Ht 71.0 in | Wt 217.0 lb

## 2021-09-11 DIAGNOSIS — R9431 Abnormal electrocardiogram [ECG] [EKG]: Secondary | ICD-10-CM | POA: Diagnosis not present

## 2021-09-11 DIAGNOSIS — E782 Mixed hyperlipidemia: Secondary | ICD-10-CM

## 2021-09-11 DIAGNOSIS — R0609 Other forms of dyspnea: Secondary | ICD-10-CM

## 2021-09-11 DIAGNOSIS — Z0000 Encounter for general adult medical examination without abnormal findings: Secondary | ICD-10-CM

## 2021-09-11 NOTE — Progress Notes (Signed)
Cardiology Office Note:    Date:  09/11/2021   ID:  Karen Cobb, DOB 1961-07-02, MRN [id number]  PCP:  Debbrah Alar, NP  Cardiologist:  Jenne Campus, MD    Referring MD: Debbrah Alar, NP   No chief complaint on file. Feeling well  History of Present Illness:    Karen Cobb is a 61 y.o. female with past medical history significant for dyslipidemia she is on statin therapy already, multiple family members with premature coronary artery disease.  She came to [place] because she would like to be checked make sure everything is fine.  Her symptomatology was very minimal.  She was getting short of breath with exertion.  Did have some nonspecific ST segment changes.  Evaluation included echocardiogram which showed preserved/normal left ventricle ejection fraction, she also had calcium score which was 0.  Cholesterol has been rechecked.  She is doing well asymptomatic no chest pain tightness squeezing pressure burning chest.  Trying to stick with good diet and trying to be more active.  Past Medical History:  Diagnosis Date   Cancer ([place]) 1999   melanoma   Hyperlipidemia    Melanoma ([place]) 09/27/1999   LEVEL II RIGHT FOOT - [place]    Past Surgical History:  Procedure Laterality Date   ADENOIDECTOMY     eyelid lift surgery     MELANOMA EXCISION Right    foot   SHOULDER SURGERY Right 09/2016   TOOTH EXTRACTION     TUBAL LIGATION      Current Medications: Current Meds  Medication Sig   atorvastatin (LIPITOR) 10 MG tablet Take 1 tablet (10 mg total) by mouth daily.   calcium citrate-vitamin D (CITRACAL+D) 315-200 MG-UNIT tablet Take 1 tablet 1 day or 1 dose by mouth.   Multiple Vitamin (MULTIVITAMIN) tablet Take 1 tablet by mouth daily. Unknown strenght   omeprazole (PRILOSEC) 20 MG capsule TAKE 1 CAPSULE (20 MG TOTAL) DAILY BY MOUTH     Allergies:   Patient has no known allergies.   Social History   Socioeconomic History   Marital status: Married     Spouse name: Not on file   Number of children: Not on file   Years of education: Not on file   Highest education level: Not on file  Occupational History   Not on file  Tobacco Use   Smoking status: Former    Passive exposure: Past   Smokeless tobacco: Never  Vaping Use   Vaping Use: Never used  Substance and Sexual Activity   Alcohol use: Not Currently    Alcohol/week: 0.0 standard drinks    Comment: rarely   Drug use: No   Sexual activity: Yes    Partners: Male  Other Topics Concern   Not on file  Social History Narrative   Works at [employer]   Married   One son- born 1998   One step son, one step daughter (they are grown)    Enjoys going to the [employer].     Completed HS    Social Determinants of Health   Financial Resource Strain: Not on file  Food Insecurity: Not on file  Transportation Needs: Not on file  Physical Activity: Not on file  Stress: Not on file  Social Connections: Not on file     Family History: The patient's family history includes AAA (abdominal aortic aneurysm) in her father; CVA in her father; Colon cancer in her mother; Developmental delay  in her sister; Heart disease in her father; Hypertension in her father. ROS:   Please see the history of present illness.    All 14 point review of systems negative except as described per history of present illness  EKGs/Labs/Other Studies Reviewed:      Recent Labs: 09/03/2021: ALT 15; BUN 16; Creatinine, Ser 0.86; Potassium 4.8; Sodium 141; TSH 4.11  Recent Lipid Panel    Component Value Date/Time   CHOL 180 08/30/2020 0859   TRIG 64.0 08/30/2020 0859   HDL 55.20 08/30/2020 0859   CHOLHDL 3 08/30/2020 0859   VLDL 12.8 08/30/2020 0859   LDLCALC 112 (H) 08/30/2020 0859   LDLDIRECT 118 (H) 06/01/2021 1532    Physical Exam:    VS:  BP 112/80 (BP Location: Left Arm)    Pulse 90    Ht 5\' 11"  (1.803 m)    Wt 217 lb (98.4 kg)    SpO2 97%    BMI 30.27  kg/m     Wt Readings from Last 3 Encounters:  09/11/21 217 lb (98.4 kg)  09/03/21 216 lb (98 kg)  06/01/21 217 lb (98.4 kg)     GEN:  Well nourished, well developed in no acute distress HEENT: Normal NECK: No JVD; No carotid bruits LYMPHATICS: No lymphadenopathy CARDIAC: RRR, no murmurs, no rubs, no gallops RESPIRATORY:  Clear to auscultation without rales, wheezing or rhonchi  ABDOMEN: Soft, non-tender, non-distended MUSCULOSKELETAL:  No edema; No deformity  SKIN: Warm and dry LOWER EXTREMITIES: no swelling NEUROLOGIC:  Alert and oriented x 3 PSYCHIATRIC:  Normal affect   ASSESSMENT:    1. Mixed hyperlipidemia   2. Preventative health care   3. Dyspnea on exertion   4. Nonspecific abnormal electrocardiogram (ECG) (EKG)    PLAN:    In order of problems listed above:  Mixed dyslipidemia I did calculate her risk with cholesterol medication which is low.  We will continue present management.  She has no difficulty tolerating statin with her family history I think is beneficial for her to be on that medication. Dyspnea on exertion.  Probably partially related to deconditioning I encouraged her to be able be more active.  Echocardiogram shows structurally normal heart she does have mild MR mild AI. Nonspecific abnormal electrocardiogram.  Echocardiogram nonconcerning. We did spend some  time talking about need to exercise as well as good diet she is already aware of Mediterranean diet that I recommended.   Medication Adjustments/Labs and Tests Ordered: Current medicines are reviewed at length with the patient today.  Concerns regarding medicines are outlined above.  No orders of the defined types were placed in this encounter.  Medication changes: No orders of the defined types were placed in this encounter.   Signed, Park Liter, MD, [facility] 09/11/2021 3:30 PM    [place]

## 2021-09-11 NOTE — Patient Instructions (Signed)
Medication Instructions:  Your physician recommends that you continue on your current medications as directed. Please refer to the Current Medication list given to you today.  *If you need a refill on your cardiac medications before your next appointment, please call your pharmacy*   Lab Work: NONE  If you have labs (blood work) drawn today and your tests are completely normal, you will receive your results only by: [place] (if you have MyChart) OR A paper copy in the mail If you have any lab test that is abnormal or we need to change your treatment, we will call you to review the results.   Testing/Procedures:  NONE   Follow-Up: At [facility], you and your health needs are our priority.  As part of our continuing mission to provide you with exceptional heart care, we have created designated Provider Care Teams.  These Care Teams include your primary Cardiologist (physician) and Advanced Practice Providers (APPs -  Physician Assistants and Nurse Practitioners) who all work together to provide you with the care you need, when you need it.  We recommend signing up for the patient portal called "MyChart".  Sign up information is provided on this After Visit Summary.  MyChart is used to connect with patients for Virtual Visits (Telemedicine).  Patients are able to view lab/test results, encounter notes, upcoming appointments, etc.  Non-urgent messages can be sent to your provider as well.   To learn more about what you can do with MyChart, go to [email or web address].    Your next appointment:   1 year(s)  The format for your next appointment:   In Person  Provider:   Jenne Campus, MD    Other Instructions

## 2021-09-15 ENCOUNTER — Other Ambulatory Visit: Payer: Self-pay

## 2021-09-15 ENCOUNTER — Ambulatory Visit (HOSPITAL_BASED_OUTPATIENT_CLINIC_OR_DEPARTMENT_OTHER)
Admission: RE | Admit: 2021-09-15 | Discharge: 2021-09-15 | Disposition: A | Discharge status: Home / Self Care | Payer: 59 | Source: Ambulatory Visit | Attending: Family | Admitting: Family

## 2021-09-15 DIAGNOSIS — N889 Noninflammatory disorder of cervix uteri, unspecified: Secondary | ICD-10-CM | POA: Diagnosis present

## 2021-09-15 MED ORDER — GADOBUTROL 1 MMOL/ML IV SOLN
10.0000 mL | Freq: Once | INTRAVENOUS | Status: AC | PRN
  Administered 2021-09-15: 10 mL via INTRAVENOUS

## 2021-09-18 ENCOUNTER — Encounter (HOSPITAL_BASED_OUTPATIENT_CLINIC_OR_DEPARTMENT_OTHER): Payer: Self-pay

## 2021-09-18 ENCOUNTER — Ambulatory Visit (HOSPITAL_BASED_OUTPATIENT_CLINIC_OR_DEPARTMENT_OTHER)
Admission: RE | Admit: 2021-09-18 | Discharge: 2021-09-18 | Disposition: A | Discharge status: Home / Self Care | Payer: 59 | Source: Ambulatory Visit | Attending: Family | Admitting: Family

## 2021-09-18 ENCOUNTER — Other Ambulatory Visit: Payer: Self-pay | Admitting: Family

## 2021-09-18 ENCOUNTER — Other Ambulatory Visit: Payer: Self-pay

## 2021-09-18 DIAGNOSIS — Z1231 Encounter for screening mammogram for malignant neoplasm of breast: Secondary | ICD-10-CM | POA: Insufficient documentation

## 2021-09-18 DIAGNOSIS — Z0000 Encounter for general adult medical examination without abnormal findings: Secondary | ICD-10-CM | POA: Insufficient documentation

## 2021-10-30 ENCOUNTER — Encounter: Payer: Self-pay | Admitting: Family

## 2022-01-15 ENCOUNTER — Other Ambulatory Visit: Payer: Self-pay | Admitting: Family

## 2022-02-20 ENCOUNTER — Encounter: Payer: Self-pay | Admitting: Family

## 2022-02-21 ENCOUNTER — Encounter: Payer: Self-pay | Admitting: Family Medicine

## 2022-02-21 ENCOUNTER — Telehealth (INDEPENDENT_AMBULATORY_CARE_PROVIDER_SITE_OTHER): Payer: 59 | Admitting: Family Medicine

## 2022-02-21 VITALS — Temp 99.0°F | Wt 215.0 lb

## 2022-02-21 DIAGNOSIS — U071 COVID-19: Secondary | ICD-10-CM | POA: Diagnosis not present

## 2022-02-21 MED ORDER — NIRMATRELVIR/RITONAVIR (PAXLOVID)TABLET: ORAL_TABLET | ORAL | 0 refills | Status: DC

## 2022-02-21 NOTE — Progress Notes (Signed)
Tested pos last night Symptoms for 2-3 days Tired, chills, fever, headache Fever of 104 last night but 99 this morning after ibuprofen

## 2022-02-21 NOTE — Progress Notes (Signed)
Virtual Video Visit via MyChart Note  I connected with  [name] on 02/21/22 at  9:40 AM EDT by the video enabled telemedicine application for MyChart, and verified that I am speaking with the correct person using two identifiers.   I introduced myself as a Designer, jewellery with the practice. We discussed the limitations of evaluation and management by telemedicine and the availability of in person appointments. The patient expressed understanding and agreed to proceed.  Participating parties in this visit include: The patient and the nurse practitioner listed.  The patient is: At home I am: In the office - [facility]  Subjective:    CC: COVID  HPI: Karen Cobb is a 61 y.o. year old female presenting today via [place] today for COVID + test.  Patient reports about 1-2 days ago she started feeling "off" with chills, fatigue. She ended up developing a fever yesterday that has fluctuated 99-104F. She has had bad headaches, occasional/mild cough, sinus congestion and COVID + test today. She has been getting some relief with Mucinex DM and Ibuprofen. She denies any GI/GU changes, chest pain, dyspnea, wheezing, etc. She is requesting Paxlovid.      Past medical history, Surgical history, Family history not pertinant except as noted below, Social history, Allergies, and medications have been entered into the medical record, reviewed, and corrections made.   Review of Systems:  All review of systems negative except what is listed in the HPI   Objective:    General:  Speaking clearly in complete sentences. Absent shortness of breath noted.   Alert and oriented x3.   Normal judgment.  Absent acute distress.   Impression and Recommendations:    1. COVID-19 - nirmatrelvir/ritonavir EUA (PAXLOVID) 20 x 150 MG & 10 x '100MG'$  TABS; 1 dose p.o. twice daily for 5 days, may use any available formulation at pharmacy with the same total dosage.   Dispense: 30 tablet; Refill: 0  Relatively mild symptoms with good prognosis. Recommend starting with conservative measures (information attached to AVS). She is requesting Paxlovid (overweight, HLD hx) - will send in. Medication education provided - aware that she needs to start within the first 5 days of symptoms if she decides she wants it and should skip her statin while taking. She will give it another day or two to see how she feels before starting the Paxlovid in case she starts improving quickly with supportive measures only.    Follow-up if symptoms worsen or fail to improve.    I discussed the assessment and treatment plan with the patient. The patient was provided an opportunity to ask questions and all were answered. The patient agreed with the plan and demonstrated an understanding of the instructions.   The patient was advised to call back or seek an in-person evaluation if the symptoms worsen or if the condition fails to improve as anticipated.    Terrilyn Saver, NP

## 2022-02-21 NOTE — Telephone Encounter (Signed)
Patient scheduled for virtual visit with Lovena Le B today

## 2022-02-21 NOTE — Patient Instructions (Addendum)
If you decide to take the Paxlovid, start within the first 5 days of symptoms to be most effective. Current strains are more mild and your primary risk factor is your cholesterol, otherwise you are not high risk and would probably do well with supportive measures only for now. Monitor for any new or worsening symptoms and keep [place] updated. Please review the information attached.  Over the counter medications that may be helpful for symptoms:  Guaifenesin 1200 mg extended release tabs twice daily, with plenty of water For cough and congestion Brand name: Mucinex   Pseudoephedrine 30 mg, one or two tabs every 4 to 6 hours For sinus congestion Brand name: Sudafed You must get this from the pharmacy counter.  Oxymetazoline nasal spray each morning, one spray in each nostril, for NO MORE THAN 3 days  For nasal and sinus congestion Brand name: Afrin Saline nasal spray or Saline Nasal Irrigation 3-5 times a day For nasal and sinus congestion Brand names: Ocean or AYR Fluticasone nasal spray, one spray in each nostril, each morning (after oxymetazoline and saline, if used) For nasal and sinus congestion Brand name: Flonase Warm salt water gargles  For sore throat Every few hours as needed Alternate ibuprofen 400-600 mg and acetaminophen 1000 mg every 4-6 hours For fever, body aches, headache Brand names: Motrin or Advil and Tylenol Dextromethorphan 12-hour cough version 30 mg every 12 hours  For cough Brand name: Delsym Stop all other cold medications for now (Nyquil, Dayquil, Tylenol Cold, Theraflu, etc) and other non-prescription cough/cold preparations. Many of these have the same ingredients listed above and could cause an overdose of medication.   Herbal treatments that have been shown to be helpful in some patients include: Vitamin C '1000mg'$  per day Vitamin D 4000iU per day Zinc '100mg'$  per day Quercetin 25-'500mg'$  twice a day Melatonin 5-'10mg'$  at bedtime  General Instructions Allow  your body to rest Drink PLENTY of fluids Isolate yourself from everyone, even family, until test results have returned  If your COVID-19 test is positive Then you ARE INFECTED and you can pass the virus to others You must quarantine from others for a minimum of  5 days since symptoms started AND You are fever free for 24 hours WITHOUT any medication to reduce fever AND Your symptoms are improving Wear mask for the next 5 days If not improved by day 5, quarantine for the full 10 days. Do not go to the store or other public areas Do not go around household members who are not known to be infected with COVID-19 If you MUST leave your area of quarantine (example: go to a bathroom you share with others in your home), you must Wear a mask Wash your hands thoroughly Wipe down any surfaces you touch Do not share food, drinks, towels, or other items with other persons Dispose of your own tissues, food containers, etc  Once you have recovered, please continue good preventive care measures, including:  wearing a mask when in public wash your hands frequently avoid touching your face/nose/eyes cover coughs/sneezes with the inside of your elbow stay out of crowds keep a 6 foot distance from others  If you develop severe shortness of breath, uncontrolled fevers, coughing up blood, confusion, chest pain, or signs of dehydration (such as significantly decreased urine amounts or dizziness with standing) please go to the ER.

## 2022-02-27 ENCOUNTER — Encounter (INDEPENDENT_AMBULATORY_CARE_PROVIDER_SITE_OTHER): Payer: 59 | Admitting: Family

## 2022-02-27 DIAGNOSIS — R21 Rash and other nonspecific skin eruption: Secondary | ICD-10-CM | POA: Diagnosis not present

## 2022-02-27 MED ORDER — PREDNISONE 10 MG PO TABS: ORAL_TABLET | ORAL | 0 refills | Status: DC

## 2022-02-27 NOTE — Telephone Encounter (Signed)

## 2022-05-27 ENCOUNTER — Ambulatory Visit: Payer: 59 | Admitting: Dermatology

## 2022-07-10 ENCOUNTER — Other Ambulatory Visit: Payer: Self-pay | Admitting: Family

## 2022-09-04 ENCOUNTER — Ambulatory Visit (INDEPENDENT_AMBULATORY_CARE_PROVIDER_SITE_OTHER): Payer: 59 | Admitting: Family

## 2022-09-04 ENCOUNTER — Other Ambulatory Visit (HOSPITAL_COMMUNITY)
Admission: RE | Admit: 2022-09-04 | Discharge: 2022-09-04 | Disposition: A | Discharge status: Home / Self Care | Payer: 59 | Source: Ambulatory Visit | Attending: Family | Admitting: Family

## 2022-09-04 ENCOUNTER — Encounter: Payer: Self-pay | Admitting: Family

## 2022-09-04 VITALS — BP 121/85 | HR 80 | Temp 97.7°F | Resp 16 | Ht 71.0 in | Wt 217.0 lb

## 2022-09-04 DIAGNOSIS — Z01419 Encounter for gynecological examination (general) (routine) without abnormal findings: Secondary | ICD-10-CM | POA: Insufficient documentation

## 2022-09-04 DIAGNOSIS — Z1231 Encounter for screening mammogram for malignant neoplasm of breast: Secondary | ICD-10-CM

## 2022-09-04 DIAGNOSIS — C439 Malignant melanoma of skin, unspecified: Secondary | ICD-10-CM

## 2022-09-04 DIAGNOSIS — Z0000 Encounter for general adult medical examination without abnormal findings: Secondary | ICD-10-CM | POA: Diagnosis not present

## 2022-09-04 DIAGNOSIS — E559 Vitamin D deficiency, unspecified: Secondary | ICD-10-CM | POA: Diagnosis not present

## 2022-09-04 LAB — COMPREHENSIVE METABOLIC PANEL
ALT: 16 U/L (ref 0–35)
AST: 18 U/L (ref 0–37)
Albumin: 4.2 g/dL (ref 3.5–5.2)
Alkaline Phosphatase: 117 U/L (ref 39–117)
BUN: 16 mg/dL (ref 6–23)
CO2: 29 mEq/L (ref 19–32)
Calcium: 9.4 mg/dL (ref 8.4–10.5)
Chloride: 105 mEq/L (ref 96–112)
Creatinine, Ser: 0.81 mg/dL (ref 0.40–1.20)
GFR: 78.79 mL/min (ref >60.00)
Glucose, Bld: 83 mg/dL (ref 70–99)
Potassium: 3.9 mEq/L (ref 3.5–5.1)
Sodium: 141 mEq/L (ref 135–145)
Total Bilirubin: 0.6 mg/dL (ref 0.2–1.2)
Total Protein: 6.5 g/dL (ref 6.0–8.3)

## 2022-09-04 LAB — LIPID PANEL
Cholesterol: 180 mg/dL (ref 0–200)
HDL: 50.4 mg/dL (ref >39.00)
LDL Cholesterol: 111 mg/dL — ABNORMAL HIGH (ref 0–99)
NonHDL: 129.66
Total CHOL/HDL Ratio: 4
Triglycerides: 91 mg/dL (ref 0.0–149.0)
VLDL: 18.2 mg/dL (ref 0.0–40.0)

## 2022-09-04 LAB — CBC WITH DIFFERENTIAL/PLATELET
Basophils Absolute: 0 10*3/uL (ref 0.0–0.1)
Basophils Relative: 0.9 % (ref 0.0–3.0)
Eosinophils Absolute: 0.1 10*3/uL (ref 0.0–0.7)
Eosinophils Relative: 2.9 % (ref 0.0–5.0)
HCT: 38.6 % (ref 36.0–46.0)
Hemoglobin: 13.1 g/dL (ref 12.0–15.0)
Lymphocytes Relative: 33.9 % (ref 12.0–46.0)
Lymphs Abs: 1.7 10*3/uL (ref 0.7–4.0)
MCHC: 33.9 g/dL (ref 30.0–36.0)
MCV: 87.6 fl (ref 78.0–100.0)
Monocytes Absolute: 0.4 10*3/uL (ref 0.1–1.0)
Monocytes Relative: 7.1 % (ref 3.0–12.0)
Neutro Abs: 2.8 10*3/uL (ref 1.4–7.7)
Neutrophils Relative %: 55.2 % (ref 43.0–77.0)
Platelets: 265 10*3/uL (ref 150.0–400.0)
RBC: 4.4 Mil/uL (ref 3.87–5.11)
RDW: 13.8 % (ref 11.5–15.5)
WBC: 5.1 10*3/uL (ref 4.0–10.5)

## 2022-09-04 LAB — TSH: TSH: 4.13 u[IU]/mL (ref 0.35–5.50)

## 2022-09-04 LAB — VITAMIN D 25 HYDROXY (VIT D DEFICIENCY, FRACTURES): VITD: 38.76 ng/mL (ref 30.00–100.00)

## 2022-09-04 MED ORDER — ATORVASTATIN CALCIUM 10 MG PO TABS: 10.0000 mg | ORAL_TABLET | Freq: Every day | ORAL | 1 refills | Status: DC

## 2022-09-04 NOTE — Addendum Note (Signed)
Addended by: Jiles Prows on: 09/04/2022 08:48 AM   Modules accepted: Orders

## 2022-09-04 NOTE — Assessment & Plan Note (Signed)
Continue healthy diet, exercise and weight loss efforts. Ordered mammo/colo scheduled. Encouraged pt to get Covid booster.

## 2022-09-04 NOTE — Progress Notes (Signed)
Subjective:     Patient ID: Karen Cobb, female    DOB: [birth year], 62 y.o.   MRN: [id number]  Chief Complaint  Patient presents with   Annual Exam    HPI  Patient presents today for complete physical.  Immunizations: will consider covid booster at the pharmacy Diet: fair diet Wt Readings from Last 3 Encounters:  09/04/22 217 lb (98.4 kg)  02/21/22 215 lb (97.5 kg)  09/11/21 217 lb (98.4 kg)  Exercise: Intermittent Colonoscopy:  2018, scheduled Pap Smear: 2020 Mammogram: due Vision: up to date Dental: up to date    Health Maintenance Due  Topic Date Due   COVID-19 Vaccine (4 - 2023-24 season) 03/22/2022   PAP SMEAR-Modifier  07/18/2022    Past Medical History:  Diagnosis Date   Cancer ([place]) 1999   melanoma   Hyperlipidemia    Melanoma ([place]) 09/27/1999   LEVEL II RIGHT FOOT - [place] BY DR. Benson [place]    Past Surgical History:  Procedure Laterality Date   ADENOIDECTOMY     eyelid lift surgery     MELANOMA EXCISION Right    foot   SHOULDER SURGERY Right 09/2016   TOOTH EXTRACTION     TUBAL LIGATION      Family History  Problem Relation Age of Onset   Colon cancer Mother    Heart disease Father        CABG x 4    Hypertension Father    CVA Father        "mini strokes"   AAA (abdominal aortic aneurysm) Father        s/p stent   Developmental delay Sister     Social History   Socioeconomic History   Marital status: Married    Spouse name: Not on file   Number of children: Not on file   Years of education: Not on file   Highest education level: Not on file  Occupational History   Not on file  Tobacco Use   Smoking status: Former    Passive exposure: Past   Smokeless tobacco: Never  Vaping Use   Vaping Use: Never used  Substance and Sexual Activity   Alcohol use: Not Currently    Alcohol/week: 0.0 standard drinks of alcohol    Comment: rarely   Drug use: No   Sexual activity: Yes    Partners: Male  Other Topics Concern   Not on file   Social History Narrative   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     Completed HS    Social Determinants of Health   Financial Resource Strain: Not on file  Food Insecurity: Not on file  Transportation Needs: Not on file  Physical Activity: Not on file  Stress: Not on file  Social Connections: Not on file  Intimate Partner Violence: Not on file    Outpatient Medications Prior to Visit  Medication Sig Dispense Refill   calcium citrate-vitamin D (CITRACAL+D) 315-200 MG-UNIT tablet Take 1 tablet 1 day or 1 dose by mouth.     Multiple Vitamin (MULTIVITAMIN) tablet Take 1 tablet by mouth daily. Unknown strenght     omeprazole (PRILOSEC) 20 MG capsule TAKE 1 CAPSULE BY MOUTH EVERY DAY 90 capsule 1   atorvastatin (LIPITOR) 10 MG tablet TAKE 1 TABLET BY MOUTH EVERY DAY 90  tablet 3   nirmatrelvir/ritonavir EUA (PAXLOVID) 20 x 150 MG & 10 x 100MG TABS 1 dose p.o. twice daily for 5 days, may use any available formulation at pharmacy with the same total dosage. 30 tablet 0   predniSONE (DELTASONE) 10 MG tablet 4 tabs by mouth once daily for 2 days, then 3 tabs daily x 2 days, then 2 tabs daily x 2 days, then 1 tab daily x 2 days 20 tablet 0   No facility-administered medications prior to visit.    No Known Allergies  Review of Systems  Constitutional:  Negative for weight loss.  HENT:  Negative for congestion and hearing loss.   Eyes:  Negative for blurred vision.  Respiratory:  Negative for cough.   Cardiovascular:  Positive for leg swelling (rarely).  Gastrointestinal:  Negative for constipation, diarrhea and vomiting.  Genitourinary:  Negative for dysuria and frequency.  Musculoskeletal:  Negative for joint pain and myalgias.  Skin:  Negative for rash.  Neurological:  Negative for headaches.  Psychiatric/Behavioral:         Denies  depression/anxiety       Objective:    Physical Exam  BP 121/85 (BP Location: Right Arm, Patient Position: Sitting, Cuff Size: Small)   Pulse 80   Temp 97.7 F (36.5 C) (Oral)   Resp 16   Ht 5' 11"$  (1.803 m)   Wt 217 lb (98.4 kg)   SpO2 98%   BMI 30.27 kg/m  Wt Readings from Last 3 Encounters:  09/04/22 217 lb (98.4 kg)  02/21/22 215 lb (97.5 kg)  09/11/21 217 lb (98.4 kg)   Physical Exam  Constitutional: She is oriented to person, place, and time. She appears well-developed and well-nourished. No distress.  HENT:  Head: Normocephalic and atraumatic.  Right Ear: Tympanic membrane and ear canal normal.  Left Ear: Tympanic membrane and ear canal normal.  Mouth/Throat: Oropharynx is clear and moist.  Eyes: Pupils are equal, round, and reactive to light. No scleral icterus.  Neck: Normal range of motion. No thyromegaly present.  Cardiovascular: Normal rate and regular rhythm.   No murmur heard. Pulmonary/Chest: Effort normal and breath sounds normal. No respiratory distress. He has no wheezes. She has no rales. She exhibits no tenderness.  Abdominal: Soft. Bowel sounds are normal. She exhibits no distension and no mass. There is no tenderness. There is no rebound and no guarding.  Musculoskeletal: She exhibits no edema.  Lymphadenopathy:    She has no cervical adenopathy.  Neurological: She is alert and oriented to person, place, and time. She has normal patellar reflexes. She exhibits normal muscle tone. Coordination normal.  Skin: Skin is warm and dry.  Psychiatric: She has a normal mood and affect. Her behavior is normal. Judgment and thought content normal.  Breasts: Examined lying Right: Without masses, retractions, discharge or axillary adenopathy.  Left: Without masses, retractions, discharge or axillary adenopathy.  Inguinal/mons: Normal without inguinal adenopathy  External genitalia: Normal  BUS/Urethra/Skene's glands: Normal  Bladder: Normal  Vagina: Normal   Cervix: Normal  Uterus: normal in size, shape and contour. Midline and mobile  Adnexa/parametria:  Rt: Without masses or tenderness.  Lt: Without masses or tenderness.  Anus and perineum: Normal            Assessment & Plan:       Assessment & Plan:   Problem List Items Addressed This Visit       Unprioritized   Vitamin D deficiency   Relevant Orders   VITAMIN  D 25 Hydroxy (Vit-D Deficiency, Fractures)   Preventative health care    Continue healthy diet, exercise and weight loss efforts. Ordered mammo/colo scheduled. Encouraged pt to get Covid booster.       Relevant Orders   Comp Met (CMET)   TSH   Lipid panel   CBC w/Diff   Melanoma ([place])   Relevant Orders   Ambulatory referral to Dermatology   Other Visit Diagnoses     Breast cancer screening by mammogram    -  Primary   Relevant Orders   MM 3D SCREEN BREAST BILATERAL       I have discontinued Varney Baas. Perry's nirmatrelvir/ritonavir and predniSONE. I have also changed her atorvastatin. Additionally, I am having her maintain her multivitamin, calcium citrate-vitamin D, and omeprazole.  Meds ordered this encounter  Medications   atorvastatin (LIPITOR) 10 MG tablet    Sig: Take 1 tablet (10 mg total) by mouth daily.    Dispense:  90 tablet    Refill:  1    Order Specific Question:   Supervising Provider    Answer:   Penni Homans A [[id number]]

## 2022-09-05 LAB — CYTOLOGY - PAP
Comment: NEGATIVE
Diagnosis: UNDETERMINED — AB
High risk HPV: NEGATIVE

## 2022-09-06 ENCOUNTER — Encounter: Payer: Self-pay | Admitting: Family

## 2022-09-10 ENCOUNTER — Inpatient Hospital Stay (HOSPITAL_BASED_OUTPATIENT_CLINIC_OR_DEPARTMENT_OTHER): Admission: RE | Admit: 2022-09-10 | Payer: 59 | Source: Ambulatory Visit

## 2022-09-24 ENCOUNTER — Encounter (HOSPITAL_BASED_OUTPATIENT_CLINIC_OR_DEPARTMENT_OTHER): Payer: Self-pay

## 2022-09-24 ENCOUNTER — Ambulatory Visit (HOSPITAL_BASED_OUTPATIENT_CLINIC_OR_DEPARTMENT_OTHER)
Admission: RE | Admit: 2022-09-24 | Discharge: 2022-09-24 | Disposition: A | Discharge status: Home / Self Care | Payer: 59 | Source: Ambulatory Visit | Attending: Family | Admitting: Family

## 2022-09-24 DIAGNOSIS — Z1231 Encounter for screening mammogram for malignant neoplasm of breast: Secondary | ICD-10-CM | POA: Insufficient documentation

## 2022-10-25 ENCOUNTER — Other Ambulatory Visit: Payer: Self-pay

## 2022-10-25 MED ORDER — ATORVASTATIN CALCIUM 10 MG PO TABS: 10.0000 mg | ORAL_TABLET | Freq: Every day | ORAL | 1 refills | Status: DC

## 2023-02-12 ENCOUNTER — Other Ambulatory Visit: Payer: Self-pay | Admitting: Family

## 2023-06-15 ENCOUNTER — Other Ambulatory Visit: Payer: Self-pay | Admitting: Family

## 2023-09-08 ENCOUNTER — Encounter: Payer: 59 | Admitting: Family

## 2023-09-09 ENCOUNTER — Encounter: Payer: Self-pay | Admitting: Family

## 2023-09-09 ENCOUNTER — Telehealth: Payer: Self-pay | Admitting: Family

## 2023-09-09 ENCOUNTER — Ambulatory Visit (INDEPENDENT_AMBULATORY_CARE_PROVIDER_SITE_OTHER): Payer: 59 | Admitting: Family

## 2023-09-09 VITALS — BP 122/70 | HR 76 | Temp 98.7°F | Resp 16 | Ht 71.0 in | Wt 225.0 lb

## 2023-09-09 DIAGNOSIS — Z0000 Encounter for general adult medical examination without abnormal findings: Secondary | ICD-10-CM

## 2023-09-09 DIAGNOSIS — E782 Mixed hyperlipidemia: Secondary | ICD-10-CM | POA: Diagnosis not present

## 2023-09-09 DIAGNOSIS — Z1231 Encounter for screening mammogram for malignant neoplasm of breast: Secondary | ICD-10-CM

## 2023-09-09 DIAGNOSIS — Z86006 Personal history of melanoma in-situ: Secondary | ICD-10-CM

## 2023-09-09 DIAGNOSIS — K219 Gastro-esophageal reflux disease without esophagitis: Secondary | ICD-10-CM | POA: Diagnosis not present

## 2023-09-09 LAB — LIPID PANEL
Cholesterol: 170 mg/dL (ref 0–200)
HDL: 54.2 mg/dL (ref >39.00)
LDL Cholesterol: 100 mg/dL — ABNORMAL HIGH (ref 0–99)
NonHDL: 115.85
Total CHOL/HDL Ratio: 3
Triglycerides: 79 mg/dL (ref 0.0–149.0)
VLDL: 15.8 mg/dL (ref 0.0–40.0)

## 2023-09-09 LAB — COMPREHENSIVE METABOLIC PANEL
ALT: 15 U/L (ref 0–35)
AST: 16 U/L (ref 0–37)
Albumin: 4.1 g/dL (ref 3.5–5.2)
Alkaline Phosphatase: 117 U/L (ref 39–117)
BUN: 23 mg/dL (ref 6–23)
CO2: 29 meq/L (ref 19–32)
Calcium: 9.1 mg/dL (ref 8.4–10.5)
Chloride: 105 meq/L (ref 96–112)
Creatinine, Ser: 0.82 mg/dL (ref 0.40–1.20)
GFR: 77.09 mL/min (ref >60.00)
Glucose, Bld: 94 mg/dL (ref 70–99)
Potassium: 4.3 meq/L (ref 3.5–5.1)
Sodium: 141 meq/L (ref 135–145)
Total Bilirubin: 0.5 mg/dL (ref 0.2–1.2)
Total Protein: 6.5 g/dL (ref 6.0–8.3)

## 2023-09-09 NOTE — Patient Instructions (Signed)
 VISIT SUMMARY:  Karen Cobb, it was a pleasure seeing you today for your annual physical exam. We discussed several aspects of your health, including your screenings, diet, exercise, and some specific symptoms you've been experiencing. Below is a summary of our discussion and the plan moving forward.  YOUR PLAN:  -CERVICAL CANCER SCREENING: Your last Pap smear was in a 'gray zone,' but since your HPV test was negative, you do not need another Pap smear for three years.  -BREAST CANCER SCREENING: You are due for your next mammogram in three weeks. Please make sure to schedule this important screening.  -DIET AND EXERCISE: You mentioned that your diet could be improved and that you do not engage in regular exercise. I encourage you to adopt a healthier diet and incorporate regular physical activity, such as walking or mowing the lawn, into your routine.  -COLONOSCOPY: You had a colonoscopy outside our system that revealed a polyp. We need to obtain the report from [facility] to review your results.  -CATARACTS: You have cataracts causing blurry vision. I will refer you to an ophthalmologist for lens replacement surgery.  -INTERMITTENT LEG SWELLING: You experience intermittent swelling in your feet, ankles, and sometimes knees. We will monitor this and reassess if the symptoms persist or worsen.  -COUGH: You have had a persistent 'smoker's cough' since having COVID-19 two years ago. We will continue to monitor this and reassess if it persists or worsens.  -GENERAL HEALTH MAINTENANCE: Your last cholesterol and metabolic panel were normal. Continue taking Omeprazole as prescribed. I encourage you to focus on weight loss, a healthy diet, and regular exercise. We will schedule a follow-up visit in one year.  INSTRUCTIONS:  Please schedule your mammogram in three weeks and ensure we receive the colonoscopy report from [facility]. Continue with your current medications and lifestyle adjustments, and we will see you  for a follow-up visit in one year.

## 2023-09-09 NOTE — Progress Notes (Signed)
 Subjective:     Patient ID: Karen Cobb, female    DOB: 10-Jul-1961, 63 y.o.   MRN: [id number]  Chief Complaint  Patient presents with   Annual Exam    HPI  Discussed the use of AI scribe software for clinical note transcription with the patient, who gave verbal consent to proceed.  History of Present Illness   Karen Cobb is a 63 year old female who presents for an annual physical exam.  Her previous Pap smear was ASCUS but HPV testing was negative, so a repeat is not needed for three years. Her last mammogram was in March 2024, and she is due for another in three weeks. She had a colonoscopy outside the current medical system, which revealed a polyp, but she does not have the report.  She requires lens replacement surgery for cataracts, which cause persistent blurriness. No surgeries this year.  She experiences intermittent swelling in her feet, ankles, and sometimes knees over the past year. No acute symptoms reported during the visit.  She mentions sinus and allergy issues, for which she takes medication. No acute symptoms today.  She has a non-itchy skin rash behind her neck. No acute concerns noted.  She reports dietary improvements due to using a grill but admits to snacking and not engaging in regular exercise. She mows the grass for physical activity when the weather permits.  She received a flu shot but has not had the latest COVID booster.      Wt Readings from Last 3 Encounters:  09/09/23 225 lb (102.1 kg)  09/04/22 217 lb (98.4 kg)  02/21/22 215 lb (97.5 kg)       Health Maintenance Due  Topic Date Due   COVID-19 Vaccine (4 - 2024-25 season) 03/23/2023    Past Medical History:  Diagnosis Date   Cancer (HCC) 1999   melanoma   Hyperlipidemia    Melanoma (HCC) 09/27/1999   LEVEL II RIGHT FOOT - [place] BY DR. Chales Salmon    Past Surgical History:  Procedure Laterality Date   ADENOIDECTOMY     eyelid lift surgery     MELANOMA EXCISION Right    foot    SHOULDER SURGERY Right 09/2016   TOOTH EXTRACTION     TUBAL LIGATION      Family History  Problem Relation Age of Onset   Colon cancer Mother    Heart disease Father        CABG x 4    Hypertension Father    CVA Father        "mini strokes"   AAA (abdominal aortic aneurysm) Father        s/p stent   Heart failure Father    Developmental delay Sister     Social History   Socioeconomic History   Marital status: Married    Spouse name: Not on file   Number of children: Not on file   Years of education: Not on file   Highest education level: Not on file  Occupational History   Not on file  Tobacco Use   Smoking status: Former    Passive exposure: Past   Smokeless tobacco: Never  Vaping Use   Vaping status: Never Used  Substance and Sexual Activity   Alcohol use: Not Currently    Alcohol/week: 0.0 standard drinks of alcohol    Comment: rarely   Drug use: No   Sexual activity: Yes    Partners: Male  Other Topics Concern   Not on  file  Social History Narrative   Works at [employer]- [employer]- [employer]   Married   One son- born 1998   One step son, one step daughter (they are grown)    Enjoys going to the [employer].     Completed HS    Social Drivers of Corporate investment banker Strain: Not on file  Food Insecurity: Not on file  Transportation Needs: Not on file  Physical Activity: Not on file  Stress: Not on file  Social Connections: Not on file  Intimate Partner Violence: Not on file    Outpatient Medications Prior to Visit  Medication Sig Dispense Refill   atorvastatin (LIPITOR) 10 MG tablet TAKE 1 TABLET BY MOUTH EVERY DAY 90 tablet 1   calcium citrate-vitamin D (CITRACAL+D) 315-200 MG-UNIT tablet Take 1 tablet 1 day or 1 dose by mouth.     Multiple Vitamin (MULTIVITAMIN) tablet Take 1 tablet by mouth daily. Unknown strenght     omeprazole (PRILOSEC) 20 MG capsule Take 1 capsule (20 mg total) by mouth daily. 90 capsule 2    No facility-administered medications prior to visit.    No Known Allergies  Review of Systems  Constitutional:  Negative for weight loss.  HENT:  Negative for congestion and hearing loss.   Eyes:  Positive for blurred vision.  Respiratory:  Negative for cough.   Cardiovascular:  Positive for leg swelling (occasional).  Gastrointestinal:  Negative for constipation and diarrhea.  Genitourinary:  Negative for dysuria and frequency.  Musculoskeletal:  Negative for joint pain and myalgias.  Skin:  Negative for rash.  Neurological:  Negative for headaches.  Psychiatric/Behavioral:         Denies depression/anxiety       Objective:    Physical Exam   BP 122/70 (BP Location: Right Arm, Patient Position: Sitting, Cuff Size: Large)   Pulse 76   Temp 98.7 F (37.1 C) (Oral)   Resp 16   Ht 5\' 11"  (1.803 m)   Wt 225 lb (102.1 kg)   SpO2 100%   BMI 31.38 kg/m  Wt Readings from Last 3 Encounters:  09/09/23 225 lb (102.1 kg)  09/04/22 217 lb (98.4 kg)  02/21/22 215 lb (97.5 kg)   Physical Exam  Constitutional: She is oriented to person, place, and time. She appears well-developed and well-nourished. No distress.  HENT:  Head: Normocephalic and atraumatic.  Right Ear: Tympanic membrane and ear canal normal.  Left Ear: Tympanic membrane and ear canal normal.  Mouth/Throat: Oropharynx is clear and moist.  Eyes: Pupils are equal, round, and reactive to light. No scleral icterus.  Neck: Normal range of motion. No thyromegaly present.  Cardiovascular: Normal rate and regular rhythm.   No murmur heard. Pulmonary/Chest: Effort normal and breath sounds normal. No respiratory distress. He has no wheezes. She has no rales. She exhibits no tenderness.  Abdominal: Soft. Bowel sounds are normal. She exhibits no distension and no mass. There is no tenderness. There is no rebound and no guarding.  Musculoskeletal: She exhibits no edema.  Lymphadenopathy:    She has no cervical adenopathy.   Neurological: She is alert and oriented to person, place, and time. She has normal patellar reflexes. She exhibits normal muscle tone. Coordination normal.  Skin: Skin is warm and dry.  Psychiatric: She has a normal mood and affect. Her behavior is normal. Judgment and thought content normal.  Breast/Pelvic: deferred           Assessment &  Plan:       Assessment & Plan:   Problem List Items Addressed This Visit       Unprioritized   Preventative health care   Cervical Cancer Screening Last Pap smear was ASCUS but HPV negative. -No need for repeat Pap smear for 3 years.  Breast Cancer Screening Last mammogram was in March 2024. -Order mammogram to be scheduled in 3 weeks.  Diet and Exercise Patient reports poor diet and lack of regular exercise. -Encourage healthier diet and regular exercise, such as walking and mowing the lawn.  Colonoscopy Last colonoscopy was done outside of our system and had polyps. -Obtain colonoscopy report from [facility].      Hyperlipidemia - Primary   Update lipid panel, continue atorvastatin.       Relevant Orders   Lipid panel   Comp Met (CMET)   Hx of melanoma in situ   Continues to follow with dermatology.       GERD (gastroesophageal reflux disease)   Stable on omeprazole.       Other Visit Diagnoses       Breast cancer screening by mammogram       Relevant Orders   MM 3D SCREENING MAMMOGRAM BILATERAL BREAST       I am having Serena Croissant. Cashion maintain her multivitamin, calcium citrate-vitamin D, omeprazole, and atorvastatin.  No orders of the defined types were placed in this encounter.

## 2023-09-09 NOTE — Assessment & Plan Note (Signed)
Continues to follow with dermatology

## 2023-09-09 NOTE — Telephone Encounter (Signed)
 Electronic request sent

## 2023-09-09 NOTE — Assessment & Plan Note (Signed)
 Update lipid panel, continue atorvastatin.

## 2023-09-09 NOTE — Telephone Encounter (Signed)
 Please request colo from [facility].

## 2023-09-09 NOTE — Assessment & Plan Note (Signed)
 Stable on omeprazole.

## 2023-09-09 NOTE — Assessment & Plan Note (Signed)
 Cervical Cancer Screening Last Pap smear was ASCUS but HPV negative. -No need for repeat Pap smear for 3 years.  Breast Cancer Screening Last mammogram was in March 2024. -Order mammogram to be scheduled in 3 weeks.  Diet and Exercise Patient reports poor diet and lack of regular exercise. -Encourage healthier diet and regular exercise, such as walking and mowing the lawn.  Colonoscopy Last colonoscopy was done outside of our system and had polyps. -Obtain colonoscopy report from [facility].

## 2023-09-29 ENCOUNTER — Ambulatory Visit (HOSPITAL_BASED_OUTPATIENT_CLINIC_OR_DEPARTMENT_OTHER)
Admission: RE | Admit: 2023-09-29 | Discharge: 2023-09-29 | Disposition: A | Discharge status: Home / Self Care | Payer: 59 | Source: Ambulatory Visit | Attending: Family | Admitting: Family

## 2023-09-29 ENCOUNTER — Encounter (HOSPITAL_BASED_OUTPATIENT_CLINIC_OR_DEPARTMENT_OTHER): Payer: Self-pay

## 2023-09-29 DIAGNOSIS — Z1231 Encounter for screening mammogram for malignant neoplasm of breast: Secondary | ICD-10-CM | POA: Diagnosis present

## 2023-10-25 ENCOUNTER — Encounter: Payer: Self-pay | Admitting: Family

## 2023-12-12 ENCOUNTER — Other Ambulatory Visit: Payer: Self-pay | Admitting: Family

## 2024-01-30 ENCOUNTER — Other Ambulatory Visit: Payer: Self-pay | Admitting: Family

## 2024-06-21 ENCOUNTER — Other Ambulatory Visit: Payer: Self-pay | Admitting: Family

## 2024-08-12 ENCOUNTER — Encounter: Payer: Self-pay | Admitting: Family

## 2024-08-12 DIAGNOSIS — Z1231 Encounter for screening mammogram for malignant neoplasm of breast: Secondary | ICD-10-CM

## 2024-09-10 ENCOUNTER — Encounter: Payer: 59 | Admitting: Family

## 2024-09-30 ENCOUNTER — Ambulatory Visit (HOSPITAL_BASED_OUTPATIENT_CLINIC_OR_DEPARTMENT_OTHER)
